# Patient Record
Sex: Female | Born: 1985 | Race: Black or African American | Hispanic: No | Smoking: Former smoker
Health system: Southern US, Community
[De-identification: ages and names within clinical notes are randomized; demographics above are authoritative.]

## PROBLEM LIST (undated history)

## (undated) ENCOUNTER — Inpatient Hospital Stay (HOSPITAL_COMMUNITY): Payer: Self-pay

## (undated) DIAGNOSIS — E559 Vitamin D deficiency, unspecified: Secondary | ICD-10-CM

## (undated) DIAGNOSIS — IMO0002 Reserved for concepts with insufficient information to code with codable children: Secondary | ICD-10-CM

## (undated) DIAGNOSIS — B999 Unspecified infectious disease: Secondary | ICD-10-CM

## (undated) DIAGNOSIS — D649 Anemia, unspecified: Secondary | ICD-10-CM

## (undated) HISTORY — DX: Unspecified infectious disease: B99.9

## (undated) HISTORY — DX: Vitamin D deficiency, unspecified: E55.9

---
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
## 2006-11-22 ENCOUNTER — Inpatient Hospital Stay (HOSPITAL_COMMUNITY): Admission: AD | Admit: 2006-11-22 | Discharge: 2006-11-23 | Payer: Self-pay | Admitting: Gynecology

## 2006-12-06 ENCOUNTER — Encounter: Admission: RE | Admit: 2006-12-06 | Discharge: 2006-12-06 | Payer: Self-pay | Admitting: Family Medicine

## 2007-03-24 ENCOUNTER — Emergency Department (HOSPITAL_COMMUNITY): Admission: EM | Admit: 2007-03-24 | Discharge: 2007-03-24 | Payer: Self-pay | Admitting: Emergency Medicine

## 2007-06-16 DIAGNOSIS — IMO0002 Reserved for concepts with insufficient information to code with codable children: Secondary | ICD-10-CM

## 2007-06-16 DIAGNOSIS — R87619 Unspecified abnormal cytological findings in specimens from cervix uteri: Secondary | ICD-10-CM

## 2007-06-16 HISTORY — DX: Reserved for concepts with insufficient information to code with codable children: IMO0002

## 2007-06-16 HISTORY — DX: Unspecified abnormal cytological findings in specimens from cervix uteri: R87.619

## 2007-06-20 ENCOUNTER — Inpatient Hospital Stay (HOSPITAL_COMMUNITY): Admission: AD | Admit: 2007-06-20 | Discharge: 2007-06-20 | Payer: Self-pay | Admitting: Family Medicine

## 2008-01-23 ENCOUNTER — Inpatient Hospital Stay (HOSPITAL_COMMUNITY): Admission: AD | Admit: 2008-01-23 | Discharge: 2008-01-23 | Payer: Self-pay | Admitting: Obstetrics & Gynecology

## 2008-02-21 ENCOUNTER — Emergency Department (HOSPITAL_COMMUNITY): Admission: EM | Admit: 2008-02-21 | Discharge: 2008-02-21 | Payer: Self-pay | Admitting: Emergency Medicine

## 2008-09-17 ENCOUNTER — Emergency Department (HOSPITAL_COMMUNITY): Admission: EM | Admit: 2008-09-17 | Discharge: 2008-09-18 | Payer: Self-pay | Admitting: Emergency Medicine

## 2009-08-30 ENCOUNTER — Emergency Department (HOSPITAL_COMMUNITY): Admission: EM | Admit: 2009-08-30 | Discharge: 2009-08-30 | Payer: Self-pay | Admitting: Emergency Medicine

## 2010-06-17 ENCOUNTER — Emergency Department (HOSPITAL_COMMUNITY)
Admission: EM | Admit: 2010-06-17 | Discharge: 2010-06-17 | Payer: Self-pay | Source: Home / Self Care | Admitting: Emergency Medicine

## 2010-09-05 LAB — URINALYSIS, ROUTINE W REFLEX MICROSCOPIC
Bilirubin Urine: NEGATIVE
Glucose, UA: NEGATIVE mg/dL
Nitrite: NEGATIVE
Protein, ur: NEGATIVE mg/dL
Specific Gravity, Urine: 1.025 (ref 1.005–1.030)
Urobilinogen, UA: 1 mg/dL (ref 0.0–1.0)

## 2010-09-05 LAB — POCT PREGNANCY, URINE: Preg Test, Ur: NEGATIVE

## 2010-09-05 LAB — URINE MICROSCOPIC-ADD ON

## 2010-10-04 ENCOUNTER — Emergency Department (HOSPITAL_COMMUNITY)
Admission: EM | Admit: 2010-10-04 | Discharge: 2010-10-05 | Disposition: A | Discharge status: Home / Self Care | Payer: Self-pay | Attending: Emergency Medicine | Admitting: Emergency Medicine

## 2010-10-04 DIAGNOSIS — R300 Dysuria: Secondary | ICD-10-CM | POA: Insufficient documentation

## 2010-10-04 DIAGNOSIS — A5901 Trichomonal vulvovaginitis: Secondary | ICD-10-CM | POA: Insufficient documentation

## 2010-10-04 DIAGNOSIS — N949 Unspecified condition associated with female genital organs and menstrual cycle: Secondary | ICD-10-CM | POA: Insufficient documentation

## 2010-10-04 DIAGNOSIS — R109 Unspecified abdominal pain: Secondary | ICD-10-CM | POA: Insufficient documentation

## 2010-10-04 DIAGNOSIS — Z202 Contact with and (suspected) exposure to infections with a predominantly sexual mode of transmission: Secondary | ICD-10-CM | POA: Insufficient documentation

## 2010-10-04 DIAGNOSIS — N898 Other specified noninflammatory disorders of vagina: Secondary | ICD-10-CM | POA: Insufficient documentation

## 2010-10-04 DIAGNOSIS — R110 Nausea: Secondary | ICD-10-CM | POA: Insufficient documentation

## 2010-10-05 ENCOUNTER — Emergency Department (HOSPITAL_COMMUNITY): Payer: Self-pay

## 2010-10-05 LAB — URINE MICROSCOPIC-ADD ON

## 2010-10-05 LAB — WET PREP, GENITAL: Yeast Wet Prep HPF POC: NONE SEEN

## 2010-10-05 LAB — URINALYSIS, ROUTINE W REFLEX MICROSCOPIC
Bilirubin Urine: NEGATIVE
Glucose, UA: NEGATIVE mg/dL
Ketones, ur: 15 mg/dL — AB
Protein, ur: NEGATIVE mg/dL

## 2010-10-07 LAB — GC/CHLAMYDIA PROBE AMP, GENITAL
Chlamydia, DNA Probe: NEGATIVE
GC Probe Amp, Genital: NEGATIVE

## 2010-10-07 LAB — URINE CULTURE: Culture  Setup Time: 201204221127

## 2010-12-31 ENCOUNTER — Inpatient Hospital Stay (INDEPENDENT_AMBULATORY_CARE_PROVIDER_SITE_OTHER)
Admit: 2010-12-31 | Discharge: 2010-12-31 | Disposition: A | Discharge status: Home / Self Care | Payer: Self-pay | Attending: Family Medicine | Admitting: Family Medicine

## 2010-12-31 ENCOUNTER — Emergency Department (HOSPITAL_COMMUNITY)
Admission: EM | Admit: 2010-12-31 | Discharge: 2010-12-31 | Discharge status: Against Medical Advice | Payer: Self-pay | Attending: Emergency Medicine | Admitting: Emergency Medicine

## 2010-12-31 DIAGNOSIS — N760 Acute vaginitis: Secondary | ICD-10-CM

## 2010-12-31 DIAGNOSIS — R109 Unspecified abdominal pain: Secondary | ICD-10-CM | POA: Insufficient documentation

## 2010-12-31 LAB — POCT URINALYSIS DIP (DEVICE)
Glucose, UA: NEGATIVE mg/dL
Hgb urine dipstick: NEGATIVE
Leukocytes, UA: NEGATIVE
Nitrite: NEGATIVE
Urobilinogen, UA: 2 mg/dL — ABNORMAL HIGH (ref 0.0–1.0)

## 2010-12-31 LAB — WET PREP, GENITAL
Clue Cells Wet Prep HPF POC: NONE SEEN
Trich, Wet Prep: NONE SEEN

## 2010-12-31 LAB — POCT PREGNANCY, URINE: Preg Test, Ur: NEGATIVE

## 2011-01-01 LAB — GC/CHLAMYDIA PROBE AMP, GENITAL: Chlamydia, DNA Probe: NEGATIVE

## 2011-03-05 LAB — WET PREP, GENITAL: Yeast Wet Prep HPF POC: NONE SEEN

## 2011-03-05 LAB — CBC
Hemoglobin: 11.9 — ABNORMAL LOW
MCHC: 34.7
MCV: 94.5
RBC: 3.63 — ABNORMAL LOW
RDW: 12.6

## 2011-03-05 LAB — GC/CHLAMYDIA PROBE AMP, GENITAL: GC Probe Amp, Genital: NEGATIVE

## 2011-03-05 LAB — POCT PREGNANCY, URINE: Preg Test, Ur: NEGATIVE

## 2011-03-18 LAB — RAPID STREP SCREEN (MED CTR MEBANE ONLY): Streptococcus, Group A Screen (Direct): NEGATIVE

## 2011-03-26 LAB — POCT URINALYSIS DIP (DEVICE)
Bilirubin Urine: NEGATIVE
Hgb urine dipstick: NEGATIVE
Nitrite: NEGATIVE
Protein, ur: NEGATIVE
Urobilinogen, UA: 2 — ABNORMAL HIGH
pH: 7

## 2011-03-26 LAB — POCT PREGNANCY, URINE
Operator id: 239701
Preg Test, Ur: NEGATIVE

## 2011-03-26 LAB — WET PREP, GENITAL: Yeast Wet Prep HPF POC: NONE SEEN

## 2011-04-02 LAB — URINE MICROSCOPIC-ADD ON

## 2011-04-02 LAB — CBC
MCV: 93.2
Platelets: 304
RBC: 3.65 — ABNORMAL LOW
WBC: 7.4

## 2011-04-02 LAB — WET PREP, GENITAL: Trich, Wet Prep: NONE SEEN

## 2011-04-02 LAB — URINALYSIS, ROUTINE W REFLEX MICROSCOPIC
Glucose, UA: NEGATIVE
Ketones, ur: NEGATIVE
Leukocytes, UA: NEGATIVE
Nitrite: POSITIVE — AB
Specific Gravity, Urine: 1.025
pH: 6

## 2011-04-02 LAB — GC/CHLAMYDIA PROBE AMP, GENITAL: GC Probe Amp, Genital: NEGATIVE

## 2011-06-16 HISTORY — PX: MOUTH SURGERY: SHX715

## 2011-07-29 ENCOUNTER — Emergency Department (INDEPENDENT_AMBULATORY_CARE_PROVIDER_SITE_OTHER)
Admission: EM | Admit: 2011-07-29 | Discharge: 2011-07-29 | Disposition: A | Discharge status: Home / Self Care | Payer: Self-pay | Source: Home / Self Care | Attending: Emergency Medicine | Admitting: Emergency Medicine

## 2011-07-29 ENCOUNTER — Encounter (HOSPITAL_COMMUNITY): Payer: Self-pay | Admitting: Emergency Medicine

## 2011-07-29 DIAGNOSIS — J069 Acute upper respiratory infection, unspecified: Secondary | ICD-10-CM

## 2011-07-29 MED ORDER — BENZONATATE 200 MG PO CAPS: 200.0000 mg | ORAL_CAPSULE | Freq: Three times a day (TID) | ORAL | Status: AC | PRN

## 2011-07-29 MED ORDER — DICLOFENAC SODIUM 75 MG PO TBEC: 75.0000 mg | DELAYED_RELEASE_TABLET | Freq: Two times a day (BID) | ORAL | Status: DC

## 2011-07-29 NOTE — ED Notes (Signed)
Pt having a sore throat and body aches since yesterday. She has had symptoms of a sinus headache for about 3 weeks but just recently started getting cold symptoms. She has runny nose, productive cough, and difficulty swallowing.

## 2011-07-29 NOTE — ED Provider Notes (Signed)
Chief Complaint  Patient presents with  . Sore Throat    History of Present Illness:   The patient has had a four-day history of sore throat for myalgias, chills, headache, nasal congestion, sneezing, rhinorrhea with green drainage, and cough productive of yellow sputum. She's not been exposed to strep, to mono, or to influenza. She denies any shortness of breath, wheezing, nausea, or vomiting.  Review of Systems:  Other than noted above, the patient denies any of the following symptoms. Systemic:  No fever, chills, sweats, fatigue, myalgias, headache, or anorexia. Eye:  No redness, pain or drainage. ENT:  No earache, nasal congestion, rhinorrhea, sinus pressure, or sore throat. Lungs:  No cough, sputum production, wheezing, shortness of breath. Or chest pain. GI:  No nausea, vomiting, abdominal pain or diarrhea. Skin:  No rash or itching.  PMFSH:  Past medical history, family history, social history, meds, and allergies were reviewed.  Physical Exam:   Vital signs:  BP 108/66  Pulse 78  Temp(Src) 99.2 F (37.3 C) (Oral)  Resp 16  SpO2 100%  LMP 07/24/2011 General:  Alert, in no distress. Eye:  No conjunctival injection or drainage. ENT:  TMs and canals were normal, without erythema or inflammation.  Nasal mucosa was clear and uncongested, without drainage.  Mucous membranes were moist.  Tonsils were enlarged and red without exudate.  There were no oral ulcerations or lesions. Neck:  Supple, no adenopathy, tenderness or mass. Lungs:  No respiratory distress.  Lungs were clear to auscultation, without wheezes, rales or rhonchi.  Breath sounds were clear and equal bilaterally. Heart:  Regular rhythm, without gallops, murmers or rubs. Skin:  Clear, warm, and dry, without rash or lesions.   Radiology:  No results found.  Assessment:   Diagnoses that have been ruled out:  None  Diagnoses that are still under consideration:  None  Final diagnoses:  Upper respiratory infection        Plan:   1.  The following meds were prescribed:   New Prescriptions   BENZONATATE (TESSALON) 200 MG CAPSULE    Take 1 capsule (200 mg total) by mouth 3 (three) times daily as needed for cough.   DICLOFENAC (VOLTAREN) 75 MG EC TABLET    Take 1 tablet (75 mg total) by mouth 2 (two) times daily.   2.  The patient was instructed in symptomatic care and handouts were given. 3.  The patient was told to return if becoming worse in any way, if no better in 3 or 4 days, and given some red flag symptoms that would indicate earlier return.   Roque Lias, MD 07/30/11 [phone]

## 2011-07-29 NOTE — Discharge Instructions (Signed)
Most upper respiratory infections are caused by viruses and do not require antibiotics.  We try to save the antibiotics for when we really need them to avoid resistance.  This does not mean that there is nothing that can be done.  Here are a few hints about things that can be done at home to get over an upper respiratory infection quicker:  Get extra sleep and extra fluids.  Get 7 to 9 hours of sleep per night and 6 to 8 glasses of water a day.  Getting extra sleep keeps the immune system from getting run down.  Most people with an upper respiratory infection are a little dehydrated.  The extra fluids also keep the secretions liquified and easier to deal with.  Also, get extra vitamin C.  4000 mg per day is the recommended dose. For the aches, headache, and fever, acetaminophen or ibuprofen are helpful.  These can be alternated every 4 hours.  People with liver disease should avoid large amounts of acetaminophen, and people with ulcer disease, gastroesophageal reflux, gastritis, congestive heart failure, chronic kidney disease, coronary artery disease and the elderly should avoid ibuprofen. For nasal congestion try Mucinex-D, or if you're having lots of sneezing or copious clear nasal drainage Allegra-D-24 hour.  A Saline nasal spray such as Ocean Spray can also help as can decongestant sprays such as Afrin, but you should not use the decongestant sprays for more than 3 or 4 days since they can be habituating.  If nasal dryness is a problem, Ayr Nasal Gel can help moisturize your nasal passages.  Breath Rite nasal strips can also offer a non-drug alternative treatment to nasal congestion, especially at night. For people with symptoms of sinusitis, sleeping with your head elevated can be helpful.  For sinus pain, moist, hot compresses to the face may provide some relief.  Many people find that inhaling steam as in a shower or from a pot of steaming water can help. For sore throat, zinc containing lozenges such  as Cold-Eze or Zicam are helpful.  Zinc helps to fight infection and has a mild astringent effect that relieves the sore, achey throat.  Hot salt water gargles (8 oz of hot water, 1/2 tsp of table salt, and a pinch of baking soda) can give relief as well as hot beverages such as hot tea. For the cough, old time remedies such as honey or honey and lemon are tried and true.  Over the counter cough syrups such as Delsym 2 tsp every 12 hours can help as well.  It's important when you have an upper respiratory infection not to pass the infection to others.  This involves being very careful about the following:  Frequent hand washing or use of hand sanitizer, especially after coughing, sneezing, blowing your nose or touching your face, nose or eyes. Do not shake hands or touch anyone and try to avoid touching surfaces that other people use such as doorknobs, shopping carts, telephones and computer keyboards. Use tissues and dispose of them properly in a garbage can or ziplock bag. Cough into your sleeve. Do not let others eat or drink after you.  It's also important to recognize the signs of serious illness and get evaluated if they occur: Any respiratory infection that lasts more than 7 to 10 days.  Yellow nasal drainage and sputum are not reliable indicators of a bacterial infection, but if they last for more than 1 week, see your doctor. Fever and sore throat can indicate strep. Fever   and cough can indicate influenza or pneumonia. Any kind of severe symptom such as difficulty breathing, intractable vomiting, or severe pain should prompt you to see a doctor as soon as possible.   Your body's immune system is really the thing that will get rid of this infection.  Your immune system is comprised of 2 types of specialized cells called T cells and B cells.  T cells coordinate the array of cells in your body that engulf invading bacteria or viruses while B cells orchestrate the production of antibodies that  neutralize infection.  Anything we do or any medications we give you, will just strengthen your immune system or help it clear up the infection quicker.  Here are a few helpful hints to improve your immune system to help overcome this illness or to prevent future infections:  A few vitamins can improve the health of your immune system.  That's why your diet should include plenty of fruits, vegetables, fish, nuts, and whole grains.  Vitamin A and bet-carotene can increase the cells that fight infections (T cells and B cells).  Vitamin A is abundant in dark greens and orange vegetables such as spinach, greens, sweet potatoes, and carrots.  Vitamin B6 contributes to the maturation of white blood cells, the cells that fight disease.  Foods with vitamin B6 include cold cereal and bananas.  Vitamin C is credited with preventing colds because it increases white blood cells and also prevents cellular damage.  Citrus fruits, peaches and green and red bell peppers are all hight in vitamin C.  Vitamin E is an anti-oxidant that encourages the production of natural killer cells which reject foreign invaders and B cells that produce antibodies.  Foods high in vitamin E include wheat germ, nuts and seeds.  Foods high in omega-3 fatty acids found in foods like salmon, tuna and mackerel boost your immune system and help cells to engulf and absorb germs.  Probiotics are good bacteria that increase your T cells.  These can be found in yogurt and are available in supplements such as Culturelle or Align.  Moderate exercise increases the strength of your immune system and your ability to recover from illness.  I suggest 3 to 5 moderate intensity 30 minute workouts per week.    Sleep is another component of maintaining a strong immune system.  It enables your body to recuperate from the day's activities, stress and work.  My recommendation is to get between 7 and 9 hours of sleep per night.  If you smoke, try to quit  completely or at least cut down.  Drink alcohol only in moderation if at all.  No more than 2 drinks daily for men or 1 for women.  Get a flu vaccine early in the fall or if you have not gotten one yet, once this illness has run its course.  If you are over 65, a smoker, or an asthmatic, get a pneumococcal vaccine.  My final recommendation is to maintain a healthy weight.  Excess weight can impair the immune system by interfering with the way the immune system deals with invading viruses or bacteria.   Just a few good health habits can help you add years to your life and enjoy your life more.  Here are my top 10.    1.  Do not use tobacco at all. If you need help quitting, discuss this with your physician.  The same thing applies to all recreational drugs.  If you drink, drink only in  moderation.  This means no more than 2 drinks per day for men or 1 drink per day for women.  2.  Keep your weight under control.  The Body Mass Index (BMI) is one measure of a person's weight compared to height.  It can be calculated by the following formula:  Weight in pounds x 706/(height in inches)x(height in inches).  Or you can ask your health care provider to figure it out for you.  A BMI of 20 - 25 is considered ideal.  25 - 30 is overweight, and over 30 is obese.  If you are overweight, discuss healthy weight loss plans with your physician.  3.  Get 30 minutes of moderate intensity exercise (such as walking) at least 5 days a week.  4.  If you are a female and do not wish to become pregnant, use a birth control method.  You may want to discuss this with your physician.  Both males and females should use protection against STDs (such as condoms) unless in a committed, mutually monogamous, long term relationship.  5.  Eat a good diet.  Try to get at least 5 servings of fruits and vegetables per day.  Avoid excessive salt.  For most people, this means less than 2300 mg of salt per day (1 tsp).  For people at  higher risk (strong family history, personal history of hypertension, African-American ethnicity) less than 1500 mg of salt per day is recommended. Avoid sugar sweetened beverages such as sweetened tea, sodas, and juices.  Avoid eating out or fast food more than twice a week.  Avoid fried food as much as possible.  6.  Get your suggested screenings.  Women who are sexually active should get a yearly Pap smear and chlamydia screening.  Women over 40 should get a mammogram every other year and over fifty every year.  Men and women over 50 should get a colonoscopy every 10 years.  Women over 65 should get a bone density scan.  I also suggest screening for HIV and hepatitis C.  7.  Get blood pressure checked as often as possible.  Yearly screening for cholesterol level and blood glucose is also recommened.  8.  Have a primary care doctor and get an annual check up.  If you don't have one, you can call [phone] for a referral.  9.  Get your recommended immunizations.  For adults, I recommend the following:  A yearly flu vaccine in the fall, a vaccine for tetanus, diphtheria, and pertussis (whooping cough) or Tdap once every 10 years, a pneumonia vaccine at age 42, and a shingles vaccine at age 22.  Many of these can be gotten at the health department or at local pharmacies.  10.  Get sufficient sleep.  7 to 9 hours per night is the recommended amount.

## 2012-03-11 ENCOUNTER — Emergency Department (HOSPITAL_COMMUNITY)
Admission: EM | Admit: 2012-03-11 | Discharge: 2012-03-11 | Disposition: A | Discharge status: Home / Self Care | Payer: Self-pay | Attending: Emergency Medicine | Admitting: Emergency Medicine

## 2012-03-11 ENCOUNTER — Emergency Department (HOSPITAL_COMMUNITY): Payer: Self-pay

## 2012-03-11 ENCOUNTER — Encounter (HOSPITAL_COMMUNITY): Payer: Self-pay | Admitting: Emergency Medicine

## 2012-03-11 DIAGNOSIS — Z880 Allergy status to penicillin: Secondary | ICD-10-CM | POA: Insufficient documentation

## 2012-03-11 DIAGNOSIS — R091 Pleurisy: Secondary | ICD-10-CM | POA: Insufficient documentation

## 2012-03-11 DIAGNOSIS — I10 Essential (primary) hypertension: Secondary | ICD-10-CM | POA: Insufficient documentation

## 2012-03-11 LAB — URINALYSIS, ROUTINE W REFLEX MICROSCOPIC
Bilirubin Urine: NEGATIVE
Glucose, UA: NEGATIVE mg/dL
Hgb urine dipstick: NEGATIVE
Ketones, ur: NEGATIVE mg/dL
Leukocytes, UA: NEGATIVE
Protein, ur: NEGATIVE mg/dL
pH: 7 (ref 5.0–8.0)

## 2012-03-11 MED ORDER — IBUPROFEN 600 MG PO TABS: 600.0000 mg | ORAL_TABLET | Freq: Four times a day (QID) | ORAL | Status: DC | PRN

## 2012-03-11 MED ORDER — HYDROCODONE-ACETAMINOPHEN 5-500 MG PO TABS: 1.0000 | ORAL_TABLET | Freq: Four times a day (QID) | ORAL | Status: DC | PRN

## 2012-03-11 NOTE — ED Notes (Signed)
Pt c/o left upper back pain since yesterday, worse with breathing, reports cough yesterday.

## 2012-03-11 NOTE — ED Provider Notes (Signed)
History     CSN: [id number]  Arrival date & time 03/11/12  0901   First MD Initiated Contact with Patient 03/11/12 [phone]      Chief Complaint  Patient presents with  . Back Pain    (Consider location/radiation/quality/duration/timing/severity/associated sxs/prior treatment) HPI Comments: Jessica Mahoney is a 26 y.o. Female who presents to ED with complaint of left flank pain. States pain started yesterday while she was just laying down. States pain worsened over night and this morning. Yesterday felt feverish, measured her temperature and it was 101. States she is having mild cough, shortness of breath, pain with deep breathing. States no chest pain, no injury, no unusual activity, no pain with urination, no urinary frequency or urgency, no abdominal pain.   Past Medical History  Diagnosis Date  . Hypertension     History reviewed. No pertinent past surgical history.  History reviewed. No pertinent family history.  History  Substance Use Topics  . Smoking status: Never Smoker   . Smokeless tobacco: Not on file  . Alcohol Use: No    OB History    Grav Para Term Preterm Abortions TAB SAB Ect Mult Living                  Review of Systems  Constitutional: Negative for fever and chills.  HENT: Negative for neck pain and neck stiffness.   Respiratory: Positive for cough and shortness of breath. Negative for chest tightness and wheezing.   Cardiovascular: Negative for chest pain.  Gastrointestinal: Negative for nausea, vomiting, abdominal pain and abdominal distention.  Genitourinary: Positive for flank pain. Negative for dysuria, urgency, hematuria, vaginal bleeding, vaginal discharge, vaginal pain and menstrual problem.  Musculoskeletal: Positive for back pain.  Skin: Negative.   Neurological: Negative for dizziness, weakness and headaches.    Allergies  Penicillins  Home Medications   Current Outpatient Rx  Name Route Sig Dispense Refill  . DICLOFENAC SODIUM 75  MG PO TBEC Oral Take 1 tablet (75 mg total) by mouth 2 (two) times daily. 20 tablet 0    BP 101/64  Pulse 82  Temp 98.2 F (36.8 C) (Oral)  Resp 16  Ht 5\' 7"  (1.702 m)  Wt 138 lb (62.596 kg)  BMI 21.61 kg/m2  SpO2 100%  LMP 02/25/2012  Physical Exam  Nursing note and vitals reviewed. Constitutional: She is oriented to person, place, and time. She appears well-developed and well-nourished. No distress.  HENT:  Head: Normocephalic.  Eyes: Conjunctivae normal are normal.  Neck: Neck supple.  Cardiovascular: Normal rate, regular rhythm and normal heart sounds.   Pulmonary/Chest: Effort normal and breath sounds normal. No respiratory distress. She has no wheezes. She has no rales.  Abdominal: Soft. Bowel sounds are normal. She exhibits no distension. There is no tenderness. There is no rebound and no guarding.       Left CVA tenderness  Musculoskeletal: Normal range of motion. She exhibits no edema and no tenderness.  Neurological: She is alert and oriented to person, place, and time.  Skin: Skin is warm and dry.  Psychiatric: She has a normal mood and affect.    ED Course  Procedures (including critical care time)  Pt with left flank/back pain, worse with breathing and coughing. Fever yesterday. Will check UA, d-dimer, CXR.   Results for orders placed during the hospital encounter of 03/11/12  URINALYSIS, ROUTINE W REFLEX MICROSCOPIC      Component Value Range   Color, Urine YELLOW  YELLOW  APPearance CLOUDY (*) CLEAR   Specific Gravity, Urine 1.026  1.005 - 1.030   pH 7.0  5.0 - 8.0   Glucose, UA NEGATIVE  NEGATIVE mg/dL   Hgb urine dipstick NEGATIVE  NEGATIVE   Bilirubin Urine NEGATIVE  NEGATIVE   Ketones, ur NEGATIVE  NEGATIVE mg/dL   Protein, ur NEGATIVE  NEGATIVE mg/dL   Urobilinogen, UA 1.0  0.0 - 1.0 mg/dL   Nitrite NEGATIVE  NEGATIVE   Leukocytes, UA NEGATIVE  NEGATIVE  D-DIMER, QUANTITATIVE      Component Value Range   D-Dimer, Quant <0.27  0.00 - 0.48  ug/mL-FEU  PREGNANCY, URINE      Component Value Range   Preg Test, Ur NEGATIVE  NEGATIVE   Dg Chest 2 View  03/11/2012  *RADIOLOGY REPORT*  Clinical Data: Left-sided chest and back pain.  CHEST - 2 VIEW  Comparison: 10/21/2003 thoracic spine radiograph  Findings: Lungs are clear. No pleural effusion or pneumothorax. The cardiomediastinal contours are within normal limits. The visualized bones and soft tissues are without significant appreciable abnormality.  IMPRESSION: No radiographic evidence of acute cardiopulmonary process.   Original Report Authenticated By: Waneta Martins, M.D.     11:01 AM Pt's cxr, d-dimer, urine are negative. She is afebrile here. Normal vs. Suspect muscle strain vs pleurisy. Will treat with pain medications, anti inflammatories, follow up as needed.   Filed Vitals:   03/11/12 1102  BP: 96/60  Pulse: 72  Temp:   Resp: 16     1. Pleurisy       MDM          Lottie Mussel, PA 03/11/12 1124

## 2012-06-05 ENCOUNTER — Emergency Department (HOSPITAL_COMMUNITY)
Admission: EM | Admit: 2012-06-05 | Discharge: 2012-06-05 | Disposition: A | Discharge status: Home / Self Care | Payer: No Typology Code available for payment source | Attending: Emergency Medicine | Admitting: Emergency Medicine

## 2012-06-05 ENCOUNTER — Emergency Department (HOSPITAL_COMMUNITY): Payer: No Typology Code available for payment source

## 2012-06-05 ENCOUNTER — Encounter (HOSPITAL_COMMUNITY): Payer: Self-pay | Admitting: Emergency Medicine

## 2012-06-05 DIAGNOSIS — R079 Chest pain, unspecified: Secondary | ICD-10-CM | POA: Insufficient documentation

## 2012-06-05 DIAGNOSIS — Z3202 Encounter for pregnancy test, result negative: Secondary | ICD-10-CM | POA: Insufficient documentation

## 2012-06-05 DIAGNOSIS — Y92410 Unspecified street and highway as the place of occurrence of the external cause: Secondary | ICD-10-CM | POA: Insufficient documentation

## 2012-06-05 DIAGNOSIS — Y9389 Activity, other specified: Secondary | ICD-10-CM | POA: Insufficient documentation

## 2012-06-05 DIAGNOSIS — M542 Cervicalgia: Secondary | ICD-10-CM | POA: Insufficient documentation

## 2012-06-05 DIAGNOSIS — I10 Essential (primary) hypertension: Secondary | ICD-10-CM | POA: Insufficient documentation

## 2012-06-05 DIAGNOSIS — F17200 Nicotine dependence, unspecified, uncomplicated: Secondary | ICD-10-CM | POA: Insufficient documentation

## 2012-06-05 DIAGNOSIS — S0990XA Unspecified injury of head, initial encounter: Secondary | ICD-10-CM | POA: Insufficient documentation

## 2012-06-05 DIAGNOSIS — M549 Dorsalgia, unspecified: Secondary | ICD-10-CM | POA: Insufficient documentation

## 2012-06-05 LAB — POCT PREGNANCY, URINE: Preg Test, Ur: NEGATIVE

## 2012-06-05 MED ORDER — IBUPROFEN 600 MG PO TABS: 600.0000 mg | ORAL_TABLET | Freq: Four times a day (QID) | ORAL | Status: DC | PRN

## 2012-06-05 MED ORDER — CYCLOBENZAPRINE HCL 10 MG PO TABS: 10.0000 mg | ORAL_TABLET | Freq: Two times a day (BID) | ORAL | Status: DC | PRN

## 2012-06-05 NOTE — ED Provider Notes (Signed)
Medical screening examination/treatment/procedure(s) were performed by non-physician practitioner and as supervising physician I was immediately available for consultation/collaboration.  Flint Melter, MD 06/05/12 [phone]
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
## 2012-06-14 ENCOUNTER — Encounter (HOSPITAL_COMMUNITY): Payer: Self-pay

## 2012-06-14 ENCOUNTER — Inpatient Hospital Stay (HOSPITAL_COMMUNITY)
Admission: AD | Admit: 2012-06-14 | Discharge: 2012-06-14 | Disposition: A | Discharge status: Home / Self Care | Payer: Self-pay | Source: Ambulatory Visit | Attending: Obstetrics & Gynecology | Admitting: Obstetrics & Gynecology

## 2012-06-14 ENCOUNTER — Inpatient Hospital Stay (HOSPITAL_COMMUNITY): Payer: Self-pay

## 2012-06-14 DIAGNOSIS — Z3490 Encounter for supervision of normal pregnancy, unspecified, unspecified trimester: Secondary | ICD-10-CM

## 2012-06-14 DIAGNOSIS — N949 Unspecified condition associated with female genital organs and menstrual cycle: Secondary | ICD-10-CM | POA: Insufficient documentation

## 2012-06-14 DIAGNOSIS — R109 Unspecified abdominal pain: Secondary | ICD-10-CM | POA: Insufficient documentation

## 2012-06-14 DIAGNOSIS — O99891 Other specified diseases and conditions complicating pregnancy: Secondary | ICD-10-CM | POA: Insufficient documentation

## 2012-06-14 HISTORY — DX: Reserved for concepts with insufficient information to code with codable children: IMO0002

## 2012-06-14 LAB — URINALYSIS, ROUTINE W REFLEX MICROSCOPIC
Glucose, UA: NEGATIVE mg/dL
Ketones, ur: NEGATIVE mg/dL
Leukocytes, UA: NEGATIVE
Nitrite: NEGATIVE
Specific Gravity, Urine: 1.02 (ref 1.005–1.030)
pH: 7 (ref 5.0–8.0)

## 2012-06-14 LAB — OB RESULTS CONSOLE GC/CHLAMYDIA
Chlamydia: NEGATIVE
Gonorrhea: NEGATIVE

## 2012-06-14 LAB — CBC
MCV: 90.9 fL (ref 78.0–100.0)
Platelets: 274 10*3/uL (ref 150–400)
RDW: 11.6 % (ref 11.5–15.5)
WBC: 8.8 10*3/uL (ref 4.0–10.5)

## 2012-06-14 LAB — POCT PREGNANCY, URINE: Preg Test, Ur: POSITIVE — AB

## 2012-06-14 LAB — WET PREP, GENITAL
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
## 2012-06-14 NOTE — MAU Note (Signed)
[facility]. Denies vaginal bleeding. White vaginal discharge, creamy & malodorous. Lower abdominal sharp pain that comes & goes; worse after urination; started last week.

## 2012-06-15 LAB — GC/CHLAMYDIA PROBE AMP: CT Probe RNA: NEGATIVE

## 2012-07-02 NOTE — MAU Provider Note (Signed)
Attestation of Attending Supervision of Advanced Practitioner (CNM/NP): Evaluation and management procedures were performed by the Advanced Practitioner under my supervision and collaboration. I have reviewed the Advanced Practitioner's note and chart, and I agree with the management and plan.  Kajsa Butrum H. 11:53 AM   

## 2012-07-16 ENCOUNTER — Encounter (HOSPITAL_COMMUNITY): Payer: Self-pay

## 2012-07-16 ENCOUNTER — Inpatient Hospital Stay (HOSPITAL_COMMUNITY)
Admission: AD | Admit: 2012-07-16 | Discharge: 2012-07-16 | Disposition: A | Discharge status: Home / Self Care | Payer: Medicaid Other | Source: Ambulatory Visit | Attending: Obstetrics & Gynecology | Admitting: Obstetrics & Gynecology

## 2012-07-16 DIAGNOSIS — R109 Unspecified abdominal pain: Secondary | ICD-10-CM | POA: Insufficient documentation

## 2012-07-16 DIAGNOSIS — A499 Bacterial infection, unspecified: Secondary | ICD-10-CM

## 2012-07-16 DIAGNOSIS — M549 Dorsalgia, unspecified: Secondary | ICD-10-CM | POA: Insufficient documentation

## 2012-07-16 DIAGNOSIS — O2390 Unspecified genitourinary tract infection in pregnancy, unspecified trimester: Secondary | ICD-10-CM | POA: Insufficient documentation

## 2012-07-16 DIAGNOSIS — K5900 Constipation, unspecified: Secondary | ICD-10-CM | POA: Insufficient documentation

## 2012-07-16 DIAGNOSIS — O26899 Other specified pregnancy related conditions, unspecified trimester: Secondary | ICD-10-CM

## 2012-07-16 DIAGNOSIS — N760 Acute vaginitis: Secondary | ICD-10-CM | POA: Insufficient documentation

## 2012-07-16 DIAGNOSIS — B9689 Other specified bacterial agents as the cause of diseases classified elsewhere: Secondary | ICD-10-CM

## 2012-07-16 LAB — URINALYSIS, ROUTINE W REFLEX MICROSCOPIC
Bilirubin Urine: NEGATIVE
Nitrite: NEGATIVE
Specific Gravity, Urine: >1.03 — ABNORMAL HIGH (ref 1.005–1.030)
pH: 6 (ref 5.0–8.0)

## 2012-07-16 LAB — URINE MICROSCOPIC-ADD ON

## 2012-07-16 LAB — WET PREP, GENITAL: Trich, Wet Prep: NONE SEEN

## 2012-07-16 MED ORDER — METRONIDAZOLE 500 MG PO TABS: 500.0000 mg | ORAL_TABLET | Freq: Two times a day (BID) | ORAL | Status: DC

## 2012-07-16 NOTE — MAU Note (Signed)
Onset of abdominal and back pain about one hour ago after eating [place] food, denies vaginal bleeding.

## 2012-07-16 NOTE — MAU Note (Signed)
Has had constipated last BM 3 days.

## 2012-07-16 NOTE — MAU Provider Note (Signed)
History     CSN: [id number]  Arrival date and time: 07/16/12 1191   First Provider Initiated Contact with Patient 07/16/12 1904      Chief Complaint  Patient presents with  . Back Pain  . Abdominal Pain   HPI Jessica Mahoney is 27 y.o. G2P0010 [redacted]w[redacted]d weeks presenting with abdominal pain and back that began 1 hour ago.  Began while eating [place] food.   Craves spicy food.  Nauseated all the time, vomits occ.  Last 3 days, hard.  Rates as 8/10 at its worse and same now.  Decribes as gassy but now more pain.  Has white vaginal discharge without foul odor.  Denies vaginal bleeding.   Has not tried anything for pain.  Plans to see [facility] for prenatal care.  Got card in mail today will make appointment    Past Medical History  Diagnosis Date  . Abnormal Pap smear   . Hypertension     not on meds off 8 months ago.    Past Surgical History  Procedure Date  . Gynecologic cryosurgery 2007    Family History  Problem Relation Age of Onset  . Fibroids Mother   . Miscarriages / [place] Mother     2 ectopics and 1 miscarriage  . Diabetes Father   . Diabetes Maternal Aunt   . Hypertension Maternal Aunt   . Hypertension Maternal Grandmother   . Diabetes Maternal Grandmother   . Hepatitis C Maternal Grandmother     History  Substance Use Topics  . Smoking status: Former Smoker    Types: Cigarettes    Quit date: 06/11/2012  . Smokeless tobacco: Not on file  . Alcohol Use: Yes     Comment: socially- last use was last week    Allergies:  Allergies  Allergen Reactions  . Penicillins Hives and Itching    No prescriptions prior to admission    Review of Systems  Constitutional: Negative for fever and chills.  Respiratory: Negative.   Cardiovascular: Negative.   Gastrointestinal: Positive for nausea, abdominal pain and constipation. Negative for vomiting and diarrhea.  Genitourinary:       Neg for vaginal bleeding or discharge   Physical Exam   Blood pressure 121/62,  pulse 106, temperature 98.3 F (36.8 C), temperature source Oral, resp. rate 16, last menstrual period 05/08/2012.  Physical Exam  Constitutional: She is oriented to person, place, and time. She appears well-developed and well-nourished. No distress.  HENT:  Head: Normocephalic.  Neck: Normal range of motion.  Cardiovascular: Normal rate.   Respiratory: Effort normal.  GI: Soft. She exhibits no distension and no mass. There is no tenderness. There is no rebound and no guarding.  Genitourinary: There is no rash, tenderness or lesion on the right labia. There is no rash, tenderness or lesion on the left labia. Uterus is enlarged. Uterus is not tender. Cervix exhibits no motion tenderness, no discharge and no friability. Right adnexum displays no tenderness and no fullness. Left adnexum displays no tenderness and no fullness. No bleeding around the vagina. Vaginal discharge (small amount of frothy discharge without odor) found.  Neurological: She is alert and oriented to person, place, and time.  Skin: Skin is warm and dry.  Psychiatric: She has a normal mood and affect. Her behavior is normal.   Results for orders placed during the hospital encounter of 07/16/12 (from the past 24 hour(s))  URINALYSIS, ROUTINE W REFLEX MICROSCOPIC     Status: Abnormal   Collection Time  07/16/12  6:37 PM      Component Value Range   Color, Urine YELLOW  YELLOW   APPearance CLEAR  CLEAR   Specific Gravity, Urine >1.030 (*) 1.005 - 1.030   pH 6.0  5.0 - 8.0   Glucose, UA NEGATIVE  NEGATIVE mg/dL   Hgb urine dipstick NEGATIVE  NEGATIVE   Bilirubin Urine NEGATIVE  NEGATIVE   Ketones, ur NEGATIVE  NEGATIVE mg/dL   Protein, ur NEGATIVE  NEGATIVE mg/dL   Urobilinogen, UA 0.2  0.0 - 1.0 mg/dL   Nitrite NEGATIVE  NEGATIVE   Leukocytes, UA TRACE (*) NEGATIVE  URINE MICROSCOPIC-ADD ON     Status: Abnormal   Collection Time   07/16/12  6:37 PM      Component Value Range   Squamous Epithelial / LPF MANY (*) RARE    WBC, UA 3-6  <3 WBC/hpf   RBC / HPF 0-2  <3 RBC/hpf   Bacteria, UA MANY (*) RARE  WET PREP, GENITAL     Status: Abnormal   Collection Time   07/16/12  7:15 PM      Component Value Range   Yeast Wet Prep HPF POC NONE SEEN  NONE SEEN   Trich, Wet Prep NONE SEEN  NONE SEEN   Clue Cells Wet Prep HPF POC FEW (*) NONE SEEN   WBC, Wet Prep HPF POC MANY (*) NONE SEEN    Patient was concerned about the pregnancy.  She has had official ultrasound at 5+ weeks showing iup with YS.  Bedside ultrasound to confirm viability--+ cardiac activity and fetal movement seen MAU Course  Procedures  GC/CHL culture 06/14/12 here was negative.   MDM   Assessment and Plan  A:  Abdominal pain in first trimester pregnancy      Constipation      Bacterial vaginosis  P:  Rx for Flagyl      Patient was shown cardiac activity and movement on bedside ultrasound      Colace prn      Call [facility] on Monday to schedule initial prenatal visit.  Rukaya Kleinschmidt,EVE M 07/16/2012, 7:43 PM

## 2012-07-18 LAB — URINE CULTURE
Colony Count: NO GROWTH
Culture: NO GROWTH

## 2012-08-05 ENCOUNTER — Encounter: Payer: Self-pay | Admitting: Certified Nurse Midwife

## 2012-08-05 ENCOUNTER — Encounter: Payer: Medicaid Other | Admitting: Certified Nurse Midwife

## 2012-08-09 ENCOUNTER — Ambulatory Visit: Payer: Medicaid Other | Admitting: Obstetrics and Gynecology

## 2012-08-09 DIAGNOSIS — Z331 Pregnant state, incidental: Secondary | ICD-10-CM

## 2012-08-10 ENCOUNTER — Encounter: Payer: Self-pay | Admitting: Certified Nurse Midwife

## 2012-08-10 ENCOUNTER — Ambulatory Visit: Payer: Medicaid Other | Admitting: Certified Nurse Midwife

## 2012-08-10 VITALS — BP 108/58 | Wt 158.0 lb

## 2012-08-10 DIAGNOSIS — D259 Leiomyoma of uterus, unspecified: Secondary | ICD-10-CM

## 2012-08-10 DIAGNOSIS — Z331 Pregnant state, incidental: Secondary | ICD-10-CM

## 2012-08-10 DIAGNOSIS — N898 Other specified noninflammatory disorders of vagina: Secondary | ICD-10-CM

## 2012-08-10 DIAGNOSIS — Z124 Encounter for screening for malignant neoplasm of cervix: Secondary | ICD-10-CM

## 2012-08-10 DIAGNOSIS — K5900 Constipation, unspecified: Secondary | ICD-10-CM | POA: Insufficient documentation

## 2012-08-10 LAB — PRENATAL PANEL VII
Basophils Absolute: 0 10*3/uL (ref 0.0–0.1)
Basophils Relative: 0 % (ref 0–1)
HIV: NONREACTIVE
Hepatitis B Surface Ag: NEGATIVE
MCHC: 35.2 g/dL (ref 30.0–36.0)
Neutro Abs: 6.6 10*3/uL (ref 1.7–7.7)
Neutrophils Relative %: 70 % (ref 43–77)
Platelets: 267 10*3/uL (ref 150–400)
RDW: 13.6 % (ref 11.5–15.5)

## 2012-08-10 LAB — POCT WET PREP (WET MOUNT)
Trichomonas Wet Prep HPF POC: NEGATIVE
WBC, Wet Prep HPF POC: NEGATIVE

## 2012-08-10 LAB — POCT URINALYSIS DIPSTICK
Bilirubin, UA: NEGATIVE
Blood, UA: NEGATIVE
Glucose, UA: NEGATIVE
Ketones, UA: NEGATIVE
pH, UA: 5

## 2012-08-10 LAB — CULTURE, OB URINE: Colony Count: 45000
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
## 2012-08-10 NOTE — Assessment & Plan Note (Signed)
Balanced diet Increase fluids esp. Water

## 2012-08-10 NOTE — Progress Notes (Signed)
[redacted]w[redacted]d No Genetic test  Last pap Aug 2013 LMP 05-08-2012 No complaints Had GC/[facility] done Yesterday by urine

## 2012-08-11 LAB — PAP IG W/ RFLX HPV ASCU

## 2012-08-11 LAB — HEMOGLOBINOPATHY EVALUATION
Hemoglobin Other: 0 %
Hgb A2 Quant: 2.6 % (ref 2.2–3.2)
Hgb A: 97.4 % (ref 96.8–97.8)
Hgb F Quant: 0 % (ref 0.0–2.0)

## 2012-08-11 LAB — GC/CHLAMYDIA PROBE AMP, URINE: GC Probe Amp, Urine: NEGATIVE

## 2012-08-11 NOTE — Progress Notes (Signed)
NOB interview completed.  PNV samples given.  NOB work up w/ CA on 08/10/12.

## 2012-11-02 ENCOUNTER — Inpatient Hospital Stay (HOSPITAL_COMMUNITY)
Admission: AD | Admit: 2012-11-02 | Discharge: 2012-11-02 | Disposition: A | Discharge status: Home / Self Care | Payer: Medicaid Other | Source: Ambulatory Visit | Attending: Obstetrics and Gynecology | Admitting: Obstetrics and Gynecology

## 2012-11-02 ENCOUNTER — Encounter (HOSPITAL_COMMUNITY): Payer: Self-pay | Admitting: *Deleted

## 2012-11-02 DIAGNOSIS — R109 Unspecified abdominal pain: Secondary | ICD-10-CM | POA: Insufficient documentation

## 2012-11-02 DIAGNOSIS — O4700 False labor before 37 completed weeks of gestation, unspecified trimester: Secondary | ICD-10-CM | POA: Insufficient documentation

## 2012-11-02 LAB — URINALYSIS, ROUTINE W REFLEX MICROSCOPIC
Bilirubin Urine: NEGATIVE
Glucose, UA: NEGATIVE mg/dL
Hgb urine dipstick: NEGATIVE
Ketones, ur: NEGATIVE mg/dL
Nitrite: NEGATIVE
Protein, ur: NEGATIVE mg/dL
Specific Gravity, Urine: 1.01 (ref 1.005–1.030)
Urobilinogen, UA: 0.2 mg/dL (ref 0.0–1.0)
pH: 6 (ref 5.0–8.0)

## 2012-11-02 LAB — WET PREP, GENITAL
Clue Cells Wet Prep HPF POC: NONE SEEN
Trich, Wet Prep: NONE SEEN
Yeast Wet Prep HPF POC: NONE SEEN

## 2012-11-02 LAB — FETAL FIBRONECTIN: Fetal Fibronectin: NEGATIVE

## 2012-11-02 NOTE — MAU Provider Note (Signed)
History   27yo, G2P0010 at [redacted]w[redacted]d presented with lower abdominal pain/cramping and lower back "butt" pain.  She c/o of these issues at the office 5/16 but states the pain has gotten worse today.  Denies VB, UCs, LOF, recent fever, resp or GI c/o's, UTI or PIH s/s.   Chief Complaint  Patient presents with  . Abdominal Pain    OB History   Grav Para Term Preterm Abortions TAB SAB Ect Mult Living   2    1  1    0      Past Medical History  Diagnosis Date  . Abnormal Pap smear 2009    Colpo done;was normal ;Last pap 01/2012;was normal  . Hypertension     not on meds; off 9 months ago, taken off by doctor.  . Infection     Yeast x 3  . Infection     BV x 1  . Infection     UTI;may get frequently  . Fibroid     Past Surgical History  Procedure Laterality Date  . Mouth surgery  2013    tooth pulled surgically    Family History  Problem Relation Age of Onset  . Fibroids Mother   . Miscarriages / [place] Mother     2 ectopics and 1 miscarriage  . Diabetes Father 48    Started w/ insulin;now oral meds only  . Diabetes Maternal Aunt     x2  . Hypertension Maternal Aunt     x2  . Hypertension Maternal Grandmother   . Diabetes Maternal Grandmother   . Hepatitis C Maternal Grandmother   . Other Mother     Several blood transfusions d/t fibroids  . Hypertension Father   . Asthma Maternal Grandmother   . Asthma Maternal Aunt   . Arthritis Maternal Grandmother     History  Substance Use Topics  . Smoking status: Former Smoker    Types: Cigarettes    Quit date: 06/11/2012  . Smokeless tobacco: Not on file  . Alcohol Use: Yes     Comment: socially- last use prior to pregnancy    Allergies:  Allergies  Allergen Reactions  . Penicillins Hives and Itching    Prescriptions prior to admission  Medication Sig Dispense Refill  . acetaminophen (TYLENOL) 500 MG tablet Take 500 mg by mouth every 6 (six) hours as needed for pain (lower back, thigh pain).      .  Pediatric Multivit-Minerals-C (FLINTSTONES GUMMIES PO) Take 2 tablets by mouth daily.      . [DISCONTINUED] metroNIDAZOLE (FLAGYL) 500 MG tablet Take 1 tablet (500 mg total) by mouth 2 (two) times daily.  14 tablet  0    ROS: see HPI above, all other systems are negative  Physical Exam   Blood pressure 111/67, pulse 89, resp. rate 18, height 5\' 7"  (1.702 m), weight 178 lb 8 oz (80.967 kg), last menstrual period 05/08/2012.  Chest: Clear Heart: RRR Abdomen: gravid, NT Extremities: WNL  Pelvic: Dilation: Closed Effacement (%): Thick Cervical Position: Posterior Presentation: Undeterminable Exam by:: J Vesper Trant CNM   FHT: reassuring for GA UCs: none tracing  Wet prep: neg FFN: neg  ED Course  IUP at [redacted]w[redacted]d PTL vs RL pain and sciatic pain Currently treating sx with maternity belt and Tylenol  D/c home with PTL precautions F/u this week in the office to discuss further POC for abdominal and back pain Comfort measures discussed    Haroldine Laws CNM, MSN 11/02/2012 6:03 PM

## 2013-01-21 LAB — OB RESULTS CONSOLE GBS: GBS: NEGATIVE

## 2013-01-24 ENCOUNTER — Inpatient Hospital Stay (HOSPITAL_COMMUNITY): Payer: Medicaid Other

## 2013-01-24 ENCOUNTER — Inpatient Hospital Stay (HOSPITAL_COMMUNITY)
Admission: AD | Admit: 2013-01-24 | Discharge: 2013-01-24 | Disposition: A | Discharge status: Home / Self Care | Payer: Medicaid Other | Source: Ambulatory Visit | Attending: Obstetrics and Gynecology | Admitting: Obstetrics and Gynecology

## 2013-01-24 ENCOUNTER — Encounter (HOSPITAL_COMMUNITY): Payer: Self-pay | Admitting: *Deleted

## 2013-01-24 DIAGNOSIS — R42 Dizziness and giddiness: Secondary | ICD-10-CM | POA: Insufficient documentation

## 2013-01-24 DIAGNOSIS — O99891 Other specified diseases and conditions complicating pregnancy: Secondary | ICD-10-CM | POA: Insufficient documentation

## 2013-01-24 LAB — CBC WITH DIFFERENTIAL/PLATELET
Eosinophils Absolute: 0.1 10*3/uL (ref 0.0–0.7)
Hemoglobin: 11 g/dL — ABNORMAL LOW (ref 12.0–15.0)
Lymphs Abs: 1.8 10*3/uL (ref 0.7–4.0)
Monocytes Relative: 12 % (ref 3–12)
Neutro Abs: 6.7 10*3/uL (ref 1.7–7.7)
Neutrophils Relative %: 69 % (ref 43–77)
Platelets: 234 10*3/uL (ref 150–400)
RBC: 3.88 MIL/uL (ref 3.87–5.11)
WBC: 9.7 10*3/uL (ref 4.0–10.5)

## 2013-01-24 LAB — COMPREHENSIVE METABOLIC PANEL
ALT: 18 U/L (ref 0–35)
Albumin: 2.7 g/dL — ABNORMAL LOW (ref 3.5–5.2)
Alkaline Phosphatase: 128 U/L — ABNORMAL HIGH (ref 39–117)
BUN: 6 mg/dL (ref 6–23)
Chloride: 101 mEq/L (ref 96–112)
GFR calc Af Amer: >90 mL/min (ref >90)
Glucose, Bld: 90 mg/dL (ref 70–99)
Potassium: 4.2 mEq/L (ref 3.5–5.1)
Sodium: 132 mEq/L — ABNORMAL LOW (ref 135–145)
Total Bilirubin: 0.4 mg/dL (ref 0.3–1.2)
Total Protein: 6.8 g/dL (ref 6.0–8.3)

## 2013-01-24 NOTE — MAU Note (Signed)
Pt states she never lost consciousness this morning.

## 2013-01-24 NOTE — MAU Provider Note (Signed)
History   27yo G2P0010 at [redacted]w[redacted]d presents d/t episodes of dizziness and "seeing stars."  This has been an issue for most of the pregnancy but this morning it was worse.  Also experienced nausea but no emesis.  Pt reports eating well and staying hydrated.  Pt states she is currently taking Fe QD for anemia.  Denies VB, LOF, recent fever, resp or GI c/o's, UTI or PIH s/s. GFM.   Chief Complaint  Patient presents with  . Fatigue    OB History   Grav Para Term Preterm Abortions TAB SAB Ect Mult Living   2    1  1    0      Past Medical History  Diagnosis Date  . Infection     Yeast x 3  . Infection     BV x 1  . Infection     UTI;may get frequently  . Abnormal Pap smear 2009    Past Surgical History  Procedure Laterality Date  . Mouth surgery  2013    tooth pulled surgically    Family History  Problem Relation Age of Onset  . Fibroids Mother   . Miscarriages / [place] Mother     2 ectopics and 1 miscarriage  . Diabetes Father 27    Started w/ insulin;now oral meds only  . Diabetes Maternal Aunt     x2  . Hypertension Maternal Aunt     x2  . Hypertension Maternal Grandmother   . Diabetes Maternal Grandmother   . Hepatitis C Maternal Grandmother   . Other Mother     Several blood transfusions d/t fibroids  . Hypertension Father   . Asthma Maternal Grandmother   . Asthma Maternal Aunt   . Arthritis Maternal Grandmother     History  Substance Use Topics  . Smoking status: Former Smoker    Types: Cigarettes    Quit date: 06/11/2012  . Smokeless tobacco: Not on file  . Alcohol Use: Yes     Comment: socially- last use prior to pregnancy    Allergies:  Allergies  Allergen Reactions  . Penicillins Hives and Itching    Prescriptions prior to admission  Medication Sig Dispense Refill  . acetaminophen (TYLENOL) 500 MG tablet Take 500 mg by mouth every 6 (six) hours as needed for pain (lower back, thigh pain).      . Pediatric Multivit-Minerals-C (FLINTSTONES  GUMMIES PO) Take 2 tablets by mouth daily.        ROS: see HPI above, all other systems are negative   Physical Exam   Blood pressure 118/75, pulse 102, temperature 98.2 F (36.8 C), temperature source Oral, resp. rate 18, last menstrual period 05/08/2012, SpO2 98.00%.  Chest: Clear Heart: RRR Abdomen: gravid, NT Extremities: WNL  Dilation: Fingertip Effacement (%): 50 Cervical Position: Posterior Station: -3 Exam by:: J. Felipa Laroche CNM  FHT: Reassuring but not reactive UCs: Q 3-6 @ admission   Results for orders placed during the hospital encounter of 01/24/13 (from the past 24 hour(s))  CBC WITH DIFFERENTIAL     Status: Abnormal   Collection Time    01/24/13  2:28 PM      Result Value Range   WBC 9.7  4.0 - 10.5 K/uL   RBC 3.88  3.87 - 5.11 MIL/uL   Hemoglobin 11.0 (*) 12.0 - 15.0 g/dL   HCT 16.1 (*) 09.6 - 04.5 %   MCV 84.8  78.0 - 100.0 fL   MCH 28.4  26.0 - 34.0 pg  MCHC 33.4  30.0 - 36.0 g/dL   RDW 16.1  09.6 - 04.5 %   Platelets 234  150 - 400 K/uL   Neutrophils Relative % 69  43 - 77 %   Neutro Abs 6.7  1.7 - 7.7 K/uL   Lymphocytes Relative 18  12 - 46 %   Lymphs Abs 1.8  0.7 - 4.0 K/uL   Monocytes Relative 12  3 - 12 %   Monocytes Absolute 1.1 (*) 0.1 - 1.0 K/uL   Eosinophils Relative 1  0 - 5 %   Eosinophils Absolute 0.1  0.0 - 0.7 K/uL   Basophils Relative 0  0 - 1 %   Basophils Absolute 0.0  0.0 - 0.1 K/uL  COMPREHENSIVE METABOLIC PANEL     Status: Abnormal   Collection Time    01/24/13  2:28 PM      Result Value Range   Sodium 132 (*) 135 - 145 mEq/L   Potassium 4.2  3.5 - 5.1 mEq/L   Chloride 101  96 - 112 mEq/L   CO2 21  19 - 32 mEq/L   Glucose, Bld 90  70 - 99 mg/dL   BUN 6  6 - 23 mg/dL   Creatinine, Ser 4.09  0.50 - 1.10 mg/dL   Calcium 9.3  8.4 - 81.1 mg/dL   Total Protein 6.8  6.0 - 8.3 g/dL   Albumin 2.7 (*) 3.5 - 5.2 g/dL   AST 22  0 - 37 U/L   ALT 18  0 - 35 U/L   Alkaline Phosphatase 128 (*) 39 - 117 U/L   Total Bilirubin 0.4  0.3 -  1.2 mg/dL   GFR calc non Af Amer >90  >90 mL/min   GFR calc Af Amer >90  >90 mL/min    ED Course  IUP at [redacted]w[redacted]d Dizziness H/o anemia  CBC CMET Hgb 11.0  BPP 8/10 (2 off for breathing)  D/c home with labor precautions Next appointment at [facility] is 8/14   Haroldine Laws CNM, MSN 01/24/2013 1:37 PM

## 2013-01-24 NOTE — MAU Note (Signed)
Feeling faint today, has had nausea this a.m., no vomitting.  States she had full breakfast & water.  C/O lower abd, pelvic & back pain.  Denies bleeding or LOF.

## 2013-01-28 ENCOUNTER — Encounter (HOSPITAL_COMMUNITY): Payer: Self-pay

## 2013-01-28 ENCOUNTER — Inpatient Hospital Stay (HOSPITAL_COMMUNITY)
Admission: AD | Admit: 2013-01-28 | Discharge: 2013-01-28 | Disposition: A | Discharge status: Home / Self Care | Payer: Medicaid Other | Source: Ambulatory Visit | Attending: Obstetrics and Gynecology | Admitting: Obstetrics and Gynecology

## 2013-01-28 DIAGNOSIS — O479 False labor, unspecified: Secondary | ICD-10-CM | POA: Insufficient documentation

## 2013-01-28 HISTORY — DX: Anemia, unspecified: D64.9

## 2013-01-28 MED ORDER — ZOLPIDEM TARTRATE 5 MG PO TABS
5.0000 mg | ORAL_TABLET | Freq: Once | ORAL | Status: AC
  Administered 2013-01-28: 5 mg via ORAL
  Filled 2013-01-28: qty 1

## 2013-01-28 MED ORDER — OXYCODONE-ACETAMINOPHEN 5-325 MG PO TABS
2.0000 | ORAL_TABLET | Freq: Once | ORAL | Status: AC
  Administered 2013-01-28: 2 via ORAL
  Filled 2013-01-28: qty 2

## 2013-01-28 NOTE — Progress Notes (Signed)
CNM reviewed efm tracing/discussed, ok for pt to walk x1hour and recheck cervix

## 2013-01-28 NOTE — Progress Notes (Signed)
ASowder, RN answered phone for MSmith, CNM who was in delivery, notified pt here for labor eval, okay for RN to perform cervical exam and cb with results.

## 2013-01-28 NOTE — MAU Note (Signed)
Pt states here for contractions, seem more intense when pt is walking or lying on her r side, denies bleeding or lof. Cervix 0.5cm/50% on Thursday in office. Since 2030 last pm, has noted more intense ctx's, called CNM who told her to come for eval when pain was more intense.

## 2013-01-28 NOTE — MAU Note (Signed)
Pt reports having contractions 2-3 min apart. Since 1030 last night. Denies SROM or bleeding and good fetal movement reported.

## 2013-01-28 NOTE — MAU Provider Note (Signed)
History    CSN: [id number]  Arrival date and time: 01/28/13 4098   First Provider Initiated Contact with Patient 01/28/13 1223     Chief Complaint  Patient presents with  . Labor Eval   HPI Pt presents to MAU with c/o of regular painful contractions since last evening which increased in frequency and severity earlier this AM.  Denies ROM or bldg.  Reports active fetus.  SVE by RN upon arrival.  Pt ambulated x 1hr.  Repeat SVE after ambulation with slight change in cervix.  After resting upon return from walking, pt reports decreased contraction frequency and decreased intensity.   OB History   Grav Para Term Preterm Abortions TAB SAB Ect Mult Living   2    1  1    0      Past Medical History  Diagnosis Date  . Infection     Yeast x 3  . Infection     BV x 1  . Infection     UTI;may get frequently  . Abnormal Pap smear 2009  . Anemia     Past Surgical History  Procedure Laterality Date  . Mouth surgery  2013    tooth pulled surgically    Family History  Problem Relation Age of Onset  . Fibroids Mother   . Miscarriages / [place] Mother     2 ectopics and 1 miscarriage  . Diabetes Father 55    Started w/ insulin;now oral meds only  . Diabetes Maternal Aunt     x2  . Hypertension Maternal Aunt     x2  . Hypertension Maternal Grandmother   . Diabetes Maternal Grandmother   . Hepatitis C Maternal Grandmother   . Other Mother     Several blood transfusions d/t fibroids  . Hypertension Father   . Asthma Maternal Grandmother   . Asthma Maternal Aunt   . Arthritis Maternal Grandmother     History  Substance Use Topics  . Smoking status: Former Smoker    Types: Cigarettes    Quit date: 06/11/2012  . Smokeless tobacco: Never Used  . Alcohol Use: No     Comment: socially- last use prior to pregnancy    Allergies: No Known Allergies  Prescriptions prior to admission  Medication Sig Dispense Refill  . IRON PO Take 1 tablet by mouth daily.      Marland Kitchen  nystatin ointment (MYCOSTATIN) Apply 1 application topically 2 (two) times daily.      . Pediatric Multivit-Minerals-C (FLINTSTONES GUMMIES PO) Take 2 tablets by mouth daily.        Review of Systems  Constitutional: Negative.   HENT: Negative.   Eyes: Negative.   Respiratory: Negative.   Cardiovascular: Negative.   Gastrointestinal: Negative.   Genitourinary: Negative.   Musculoskeletal: Negative.   Skin: Negative.   Neurological: Negative.   Endo/Heme/Allergies: Negative.   Psychiatric/Behavioral: Negative.    Physical Exam   Blood pressure 133/79, pulse 103, temperature 97.8 F (36.6 C), temperature source Oral, resp. rate 20, height 5\' 7"  (1.702 m), weight 92.08 kg (203 lb), last menstrual period 05/08/2012.  Physical Exam  Constitutional: She is oriented to person, place, and time. She appears well-developed and well-nourished.  HENT:  Head: Normocephalic and atraumatic.  Right Ear: External ear normal.  Left Ear: External ear normal.  Nose: Nose normal.  Eyes: Conjunctivae are normal. Pupils are equal, round, and reactive to light.  Neck: Normal range of motion. Neck supple.  Cardiovascular: Normal rate, regular rhythm and  intact distal pulses.   Respiratory: Effort normal and breath sounds normal.  GI: Soft. Bowel sounds are normal. There is no tenderness. There is no rebound and no guarding.  Gravid.  Genitourinary:  Ext genitalia WNL.  BUS neg.  Speculum exam deferred.  Musculoskeletal: Normal range of motion.  Neurological: She is alert and oriented to person, place, and time. She has normal reflexes.  Skin: Skin is warm and dry.  Psychiatric: She has a normal mood and affect. Her behavior is normal.   FHR baseline 135.  Variability moderate.  Accels-present.  Decels-absent at present.  FHR Cat 1 Toco: UCs every 1.5-6 mins at present.    SVE on admission by RN 0.5/80%/-3.  SVE after walking 1.5/50%/-2/vtx/posterior.  Repeat SVE 1.5hrs after return from walking  without change, 1.5/50%/-3/vtx MAU Course  Procedures  Assessment and Plan  IUP at 37w 6d False labor  Discharged to home. RBA meds for therapeutic rest d/w pt and desires to use.  Pt given Percocet 5/325mg , 2 tabs and Ambien 5mg  1 tab at discharge with instructions reviewed. Revd fetal kick counts as well as s/s labor. RTO on Thursday, February 02, 2013 as sched for f/u.   Ahlaya Ende O. 01/28/2013, 12:44 PM

## 2013-01-31 ENCOUNTER — Encounter (HOSPITAL_COMMUNITY): Payer: Self-pay | Admitting: *Deleted

## 2013-01-31 ENCOUNTER — Inpatient Hospital Stay (HOSPITAL_COMMUNITY)
Admission: AD | Admit: 2013-01-31 | Discharge: 2013-01-31 | Disposition: A | Discharge status: Home / Self Care | Payer: Medicaid Other | Source: Ambulatory Visit | Attending: Obstetrics and Gynecology | Admitting: Obstetrics and Gynecology

## 2013-01-31 DIAGNOSIS — O99891 Other specified diseases and conditions complicating pregnancy: Secondary | ICD-10-CM | POA: Insufficient documentation

## 2013-01-31 DIAGNOSIS — O479 False labor, unspecified: Secondary | ICD-10-CM | POA: Insufficient documentation

## 2013-01-31 LAB — AMNISURE RUPTURE OF MEMBRANE (ROM) NOT AT ARMC: Amnisure ROM: NEGATIVE

## 2013-01-31 NOTE — MAU Provider Note (Signed)
History   27 yo G2P0010 at 41 2/7 weeks presented unannounced c/o ? Wetness this am at 6:30am--no leaking since.  Seen 8/16 in MAU for labor check, with cervix 1.5 cm then.  Discharged home.  Denies bleeding, reports +FM.  Patient Active Problem List   Diagnosis Date Noted  . Constipation in pregnancy 08/10/2012  . Fibroid, uterine 08/10/2012     Chief Complaint  Patient presents with  . Rupture of Membranes     OB History   Grav Para Term Preterm Abortions TAB SAB Ect Mult Living   2    1  1    0      Past Medical History  Diagnosis Date  . Infection     Yeast x 3  . Infection     BV x 1  . Infection     UTI;may get frequently  . Abnormal Pap smear 2009  . Anemia     Past Surgical History  Procedure Laterality Date  . Mouth surgery  2013    tooth pulled surgically    Family History  Problem Relation Age of Onset  . Fibroids Mother   . Miscarriages / [place] Mother     2 ectopics and 1 miscarriage  . Diabetes Father 73    Started w/ insulin;now oral meds only  . Diabetes Maternal Aunt     x2  . Hypertension Maternal Aunt     x2  . Hypertension Maternal Grandmother   . Diabetes Maternal Grandmother   . Hepatitis C Maternal Grandmother   . Other Mother     Several blood transfusions d/t fibroids  . Hypertension Father   . Asthma Maternal Grandmother   . Asthma Maternal Aunt   . Arthritis Maternal Grandmother     History  Substance Use Topics  . Smoking status: Former Smoker    Types: Cigarettes    Quit date: 06/11/2012  . Smokeless tobacco: Never Used  . Alcohol Use: No     Comment: socially- last use prior to pregnancy    Allergies: No Known Allergies  Prescriptions prior to admission  Medication Sig Dispense Refill  . IRON PO Take 1 tablet by mouth daily.      Marland Kitchen nystatin ointment (MYCOSTATIN) Apply 1 application topically 2 (two) times daily.      . Pediatric Multivit-Minerals-C (FLINTSTONES GUMMIES PO) Take 2 tablets by mouth daily.          Physical Exam   Blood pressure 124/76, pulse 89, temperature 99.3 F (37.4 C), temperature source Oral, resp. rate 18, height 5\' 8"  (1.727 m), weight 93.895 kg (207 lb), last menstrual period 05/08/2012.  Chest clear Heart RRR without murmur Abd gravid, NT Pelvic--cervix posterior, 1.5 cm, 75%, vtx, -1.  No leaking noted. Ext WNL  FHR Category 1 UCs irregular, q 2-8 min, mostly mild.  Results for orders placed during the hospital encounter of 01/31/13 (from the past 24 hour(s))  AMNISURE RUPTURE OF MEMBRANE (ROM)     Status: None   Collection Time    01/31/13  8:38 AM      Result Value Range   Amnisure ROM NEGATIVE       ED Course  IUP at 38 2/7 weeks No evidence SROM or active labor  D/C'd home with labor precautions. Keep scheduled appt on Thursday at [facility] or call/return prn.    Nigel Bridgeman CNM, [place] 01/31/2013 9:26 AM

## 2013-01-31 NOTE — MAU Note (Signed)
Patient states she had leaking of a little clear fluid at 0630. Not actively leaking at this time. Reports good fetal movement. Now having some contractions.

## 2013-02-02 ENCOUNTER — Inpatient Hospital Stay (HOSPITAL_COMMUNITY): Payer: Medicaid Other | Admitting: Anesthesiology

## 2013-02-02 ENCOUNTER — Inpatient Hospital Stay (HOSPITAL_COMMUNITY)
Admission: AD | Admit: 2013-02-02 | Discharge: 2013-02-06 | DRG: 766 | Disposition: A | Discharge status: Home / Self Care | Payer: Medicaid Other | Source: Ambulatory Visit | Attending: Obstetrics and Gynecology | Admitting: Obstetrics and Gynecology

## 2013-02-02 ENCOUNTER — Encounter (HOSPITAL_COMMUNITY): Payer: Self-pay | Admitting: Anesthesiology

## 2013-02-02 ENCOUNTER — Encounter (HOSPITAL_COMMUNITY): Payer: Self-pay | Admitting: *Deleted

## 2013-02-02 DIAGNOSIS — M545 Low back pain: Secondary | ICD-10-CM | POA: Insufficient documentation

## 2013-02-02 DIAGNOSIS — M5430 Sciatica, unspecified side: Secondary | ICD-10-CM | POA: Insufficient documentation

## 2013-02-02 DIAGNOSIS — O368190 Decreased fetal movements, unspecified trimester, not applicable or unspecified: Secondary | ICD-10-CM | POA: Diagnosis present

## 2013-02-02 DIAGNOSIS — IMO0001 Reserved for inherently not codable concepts without codable children: Secondary | ICD-10-CM

## 2013-02-02 DIAGNOSIS — B372 Candidiasis of skin and nail: Secondary | ICD-10-CM | POA: Insufficient documentation

## 2013-02-02 DIAGNOSIS — D649 Anemia, unspecified: Secondary | ICD-10-CM | POA: Diagnosis not present

## 2013-02-02 DIAGNOSIS — O9903 Anemia complicating the puerperium: Secondary | ICD-10-CM | POA: Diagnosis not present

## 2013-02-02 LAB — CBC
Platelets: 256 10*3/uL (ref 150–400)
RDW: 15.8 % — ABNORMAL HIGH (ref 11.5–15.5)
WBC: 10.8 10*3/uL — ABNORMAL HIGH (ref 4.0–10.5)

## 2013-02-02 MED ORDER — PHENYLEPHRINE 40 MCG/ML (10ML) SYRINGE FOR IV PUSH (FOR BLOOD PRESSURE SUPPORT)
80.0000 ug | PREFILLED_SYRINGE | INTRAVENOUS | Status: DC | PRN
  Filled 2013-02-02: qty 5

## 2013-02-02 MED ORDER — DIPHENHYDRAMINE HCL 50 MG/ML IJ SOLN: 12.5000 mg | INTRAMUSCULAR | Status: DC | PRN

## 2013-02-02 MED ORDER — CITRIC ACID-SODIUM CITRATE 334-500 MG/5ML PO SOLN
30.0000 mL | ORAL | Status: DC | PRN
  Administered 2013-02-03: 30 mL via ORAL
  Filled 2013-02-02 (×2): qty 15

## 2013-02-02 MED ORDER — OXYCODONE-ACETAMINOPHEN 5-325 MG PO TABS: 1.0000 | ORAL_TABLET | ORAL | Status: DC | PRN

## 2013-02-02 MED ORDER — LACTATED RINGERS IV SOLN: 500.0000 mL | INTRAVENOUS | Status: DC | PRN

## 2013-02-02 MED ORDER — EPHEDRINE 5 MG/ML INJ: 10.0000 mg | INTRAVENOUS | Status: DC | PRN

## 2013-02-02 MED ORDER — LACTATED RINGERS IV SOLN
INTRAVENOUS | Status: DC
  Administered 2013-02-02 – 2013-02-03 (×6): via INTRAVENOUS

## 2013-02-02 MED ORDER — LIDOCAINE HCL (PF) 1 % IJ SOLN
30.0000 mL | INTRAMUSCULAR | Status: AC | PRN
  Administered 2013-02-03: 30 mL via SUBCUTANEOUS
  Filled 2013-02-02 (×2): qty 30

## 2013-02-02 MED ORDER — ONDANSETRON HCL 4 MG/2ML IJ SOLN
4.0000 mg | Freq: Four times a day (QID) | INTRAMUSCULAR | Status: DC | PRN
  Administered 2013-02-02: 4 mg via INTRAVENOUS
  Filled 2013-02-02: qty 2

## 2013-02-02 MED ORDER — OXYTOCIN 40 UNITS IN LACTATED RINGERS INFUSION - SIMPLE MED: 62.5000 mL/h | INTRAVENOUS | Status: DC

## 2013-02-02 MED ORDER — FENTANYL 2.5 MCG/ML BUPIVACAINE 1/10 % EPIDURAL INFUSION (WH - ANES)
14.0000 mL/h | INTRAMUSCULAR | Status: DC | PRN
  Administered 2013-02-02 – 2013-02-03 (×3): 14 mL/h via EPIDURAL
  Filled 2013-02-02 (×3): qty 125

## 2013-02-02 MED ORDER — SODIUM BICARBONATE 8.4 % IV SOLN
INTRAVENOUS | Status: DC | PRN
  Administered 2013-02-02 – 2013-02-03 (×2): 5 mL via EPIDURAL
  Administered 2013-02-03 (×2): 3 mL via EPIDURAL
  Administered 2013-02-03: 2 mL via EPIDURAL
  Administered 2013-02-03: 8 mL via EPIDURAL

## 2013-02-02 MED ORDER — OXYTOCIN BOLUS FROM INFUSION: 500.0000 mL | INTRAVENOUS | Status: DC

## 2013-02-02 MED ORDER — ACETAMINOPHEN 325 MG PO TABS: 650.0000 mg | ORAL_TABLET | ORAL | Status: DC | PRN

## 2013-02-02 MED ORDER — LACTATED RINGERS IV SOLN
500.0000 mL | Freq: Once | INTRAVENOUS | Status: AC
  Administered 2013-02-02: 500 mL via INTRAVENOUS

## 2013-02-02 MED ORDER — EPHEDRINE 5 MG/ML INJ
10.0000 mg | INTRAVENOUS | Status: DC | PRN
  Filled 2013-02-02: qty 4

## 2013-02-02 MED ORDER — PHENYLEPHRINE 40 MCG/ML (10ML) SYRINGE FOR IV PUSH (FOR BLOOD PRESSURE SUPPORT): 80.0000 ug | PREFILLED_SYRINGE | INTRAVENOUS | Status: DC | PRN

## 2013-02-02 MED ORDER — IBUPROFEN 600 MG PO TABS: 600.0000 mg | ORAL_TABLET | Freq: Four times a day (QID) | ORAL | Status: DC | PRN

## 2013-02-02 NOTE — MAU Note (Addendum)
PT NOW SAYS SHE WORKS AT [employer] AND AT 130PM  SHE TRIPPED OVER [employer] TOY-  FELL ONTO R HIP- DOES  NOT BELIEVE  SHE HIT HER ABD.    DENIES ANY BLEEDING.  HAS BEEN HAVING UC  EVEN BEFORE SHE FELL.

## 2013-02-02 NOTE — H&P (Signed)
BULAR HICKOK is a 27 y.o. female, G2P0010 at 102w4d, presenting for labor check.  UCs since 1630.  Was checked yesterday in the office and was 1 cm / 70% / -2.  Denies VB, LOF, recent fever, resp or GI c/o's, UTI or PIH s/s. GFM. Desires epidural.  Pt also reported falling this afternoon at work, fell on her right hip, does not believe she fell on her abdomen.  Patient Active Problem List   Diagnosis Date Noted  . Constipation in pregnancy 08/10/2012  . Fibroid, uterine 08/10/2012    History of present pregnancy: Patient entered care at 13 weeks.   EDC of 02/12/13 was established by LMP.   Anatomy scan:  17 weeks, limited with normal findings and an anterior placenta.   Additional [place] evaluations:  At [redacted]w[redacted]d for anatomy f/u - anatomy WNL and scan complete.   Significant prenatal events:  n/a   Last evaluation:  02/01/13 at [redacted]w[redacted]d  OB History   Grav Para Term Preterm Abortions TAB SAB Ect Mult Living   2    1  1    0     Past Medical History  Diagnosis Date  . Abnormal Pap smear 2009  . Anemia   . Infection     Yeast x 3  . Infection     BV x 1  . Infection     UTI;may get frequently   Past Surgical History  Procedure Laterality Date  . Mouth surgery  2013    tooth pulled surgically   Family History: family history includes Arthritis in her maternal grandmother; Asthma in her maternal aunt and maternal grandmother; Diabetes in her maternal aunt and maternal grandmother; Diabetes (age of onset: 61) in her father; Fibroids in her mother; Hepatitis C in her maternal grandmother; Hypertension in her father, maternal aunt, and maternal grandmother; Miscarriages / [place] in her mother; Other in her mother. Social History:  reports that she quit smoking about 7 months ago. Her smoking use included Cigarettes. She smoked 0.00 packs per day. She has never used smokeless tobacco. She reports that she does not drink alcohol or use illicit drugs.   Prenatal Transfer Tool  Maternal  Diabetes: No Genetic Screening: Declined Maternal Ultrasounds/Referrals: Normal Fetal Ultrasounds or other Referrals:  None Maternal Substance Abuse:  No Significant Maternal Medications:  None Significant Maternal Lab Results: Lab values include: Group B Strep negative    ROS: see HPI above, all other systems are negative  No Known Allergies   Dilation: 3 Effacement (%): 80 Station: -2 Exam by:: DCALLAWAY, RN Blood pressure 122/80, pulse 89, temperature 98.9 F (37.2 C), temperature source Oral, last menstrual period 05/08/2012.  Chest clear Heart RRR without murmur Abd gravid, NT Ext: WNL  FHR: Reactive NST UCs: Q 2-5 min  Prenatal labs: ABO, Rh: O/POS/-- (02/25 1201) Antibody: NEG (02/25 1201) Rubella:   Immune RPR: NON REAC (02/25 1201)  HBsAg: NEGATIVE (02/25 1201)  HIV: NON REACTIVE (02/25 1201)  GBS: Negative (08/09 0000) Sickle cell/Hgb electrophoresis:  Normal Study Pap:  08/10/12 WNL GC:  Neg Chlamydia:  Neg Genetic screenings:  Declined  Glucola:  131 Other:  None  Assessment/Plan: IUP at [redacted]w[redacted]d Active labor GBS neg Desires epidural  Admit to BS per c/w Dr. Estanislado Pandy as attending MD Routine [facility] orders Epidural prn  Rowan Blase, MSN 02/02/2013, 8:45 PM

## 2013-02-02 NOTE — MAU Note (Addendum)
PT SAYS SHE HAS BEEN HURTING BAD SINCE 430PM.   WAS 1-2 CM IN OFFICE  YESTERDAY.   DENIES HSV AND MRSA.    GBS- NEG.

## 2013-02-02 NOTE — Anesthesia Procedure Notes (Signed)

## 2013-02-02 NOTE — Anesthesia Preprocedure Evaluation (Signed)

## 2013-02-02 NOTE — Progress Notes (Signed)
  Subjective: Pt sitting up comfortable with epidural.    Objective: BP 117/75  Pulse 115  Temp(Src) 98.9 F (37.2 C) (Oral)  Resp 18  Ht 5\' 7"  (1.702 m)  Wt 208 lb (94.348 kg)  BMI 32.57 kg/m2  LMP 05/08/2012      FHT:  Cat II UC:   regular, every 2-4 minutes  SVE:   Dilation: 6 Effacement (%): 90 Station: -2 Exam by:: Towanna Avery , CNM  Assessment / Plan:  Labor: Active labor; AROM - clear fluid Preeclampsia: no s/s Fetal Wellbeing: Cat II Pain Control: Epidural I/D: GBS neg; AROM at 2300 Anticipated MOD: SVD   Mansel Strother 02/02/2013, 11:12 PM

## 2013-02-03 ENCOUNTER — Encounter (HOSPITAL_COMMUNITY): Payer: Self-pay | Admitting: *Deleted

## 2013-02-03 ENCOUNTER — Encounter (HOSPITAL_COMMUNITY)
Admission: AD | Disposition: A | Discharge status: Home / Self Care | Payer: Self-pay | Source: Ambulatory Visit | Attending: Obstetrics and Gynecology

## 2013-02-03 DIAGNOSIS — D649 Anemia, unspecified: Secondary | ICD-10-CM | POA: Diagnosis present

## 2013-02-03 DIAGNOSIS — IMO0001 Reserved for inherently not codable concepts without codable children: Secondary | ICD-10-CM

## 2013-02-03 LAB — RPR: RPR Ser Ql: NONREACTIVE

## 2013-02-03 SURGERY — Surgical Case
Anesthesia: Epidural | Site: Abdomen | Wound class: Clean Contaminated

## 2013-02-03 MED ORDER — TERBUTALINE SULFATE 1 MG/ML IJ SOLN: 0.2500 mg | Freq: Once | INTRAMUSCULAR | Status: DC | PRN

## 2013-02-03 MED ORDER — MENTHOL 3 MG MT LOZG
1.0000 | LOZENGE | OROMUCOSAL | Status: DC | PRN
  Administered 2013-02-05 (×2): 3 mg via ORAL
  Filled 2013-02-03 (×2): qty 9

## 2013-02-03 MED ORDER — MIDAZOLAM HCL 2 MG/2ML IJ SOLN: 0.5000 mg | Freq: Once | INTRAMUSCULAR | Status: DC | PRN

## 2013-02-03 MED ORDER — KETOROLAC TROMETHAMINE 30 MG/ML IJ SOLN
INTRAMUSCULAR | Status: AC
  Administered 2013-02-03: 30 mg via INTRAVENOUS
  Filled 2013-02-03: qty 1

## 2013-02-03 MED ORDER — SCOPOLAMINE 1 MG/3DAYS TD PT72
1.0000 | MEDICATED_PATCH | Freq: Once | TRANSDERMAL | Status: DC
  Administered 2013-02-03: 1.5 mg via TRANSDERMAL

## 2013-02-03 MED ORDER — ONDANSETRON HCL 4 MG PO TABS: 4.0000 mg | ORAL_TABLET | ORAL | Status: DC | PRN

## 2013-02-03 MED ORDER — ONDANSETRON HCL 4 MG/2ML IJ SOLN: 4.0000 mg | INTRAMUSCULAR | Status: DC | PRN

## 2013-02-03 MED ORDER — FENTANYL CITRATE 0.05 MG/ML IJ SOLN
INTRAMUSCULAR | Status: AC
  Filled 2013-02-03: qty 2

## 2013-02-03 MED ORDER — NALOXONE HCL 0.4 MG/ML IJ SOLN: 0.4000 mg | INTRAMUSCULAR | Status: DC | PRN

## 2013-02-03 MED ORDER — TETANUS-DIPHTH-ACELL PERTUSSIS 5-2.5-18.5 LF-MCG/0.5 IM SUSP: 0.5000 mL | Freq: Once | INTRAMUSCULAR | Status: DC

## 2013-02-03 MED ORDER — MORPHINE SULFATE (PF) 0.5 MG/ML IJ SOLN
INTRAMUSCULAR | Status: DC | PRN
  Administered 2013-02-03: 1000 ug via EPIDURAL
  Administered 2013-02-03: 4000 ug via INTRAVENOUS

## 2013-02-03 MED ORDER — SODIUM BICARBONATE 8.4 % IV SOLN
INTRAVENOUS | Status: AC
  Filled 2013-02-03: qty 50

## 2013-02-03 MED ORDER — IBUPROFEN 600 MG PO TABS: 600.0000 mg | ORAL_TABLET | Freq: Four times a day (QID) | ORAL | Status: DC

## 2013-02-03 MED ORDER — LACTATED RINGERS IV SOLN
INTRAVENOUS | Status: DC | PRN
  Administered 2013-02-03: 16:00:00 via INTRAVENOUS

## 2013-02-03 MED ORDER — FENTANYL CITRATE 0.05 MG/ML IJ SOLN
25.0000 ug | INTRAMUSCULAR | Status: DC | PRN
  Administered 2013-02-03: 50 ug via INTRAVENOUS

## 2013-02-03 MED ORDER — SCOPOLAMINE 1 MG/3DAYS TD PT72
MEDICATED_PATCH | TRANSDERMAL | Status: AC
  Filled 2013-02-03: qty 1

## 2013-02-03 MED ORDER — LACTATED RINGERS IV SOLN
INTRAVENOUS | Status: DC
  Administered 2013-02-04: via INTRAVENOUS

## 2013-02-03 MED ORDER — MEPERIDINE HCL 25 MG/ML IJ SOLN: 6.2500 mg | INTRAMUSCULAR | Status: DC | PRN

## 2013-02-03 MED ORDER — ONDANSETRON HCL 4 MG/2ML IJ SOLN
INTRAMUSCULAR | Status: AC
  Filled 2013-02-03: qty 2

## 2013-02-03 MED ORDER — DIPHENHYDRAMINE HCL 50 MG/ML IJ SOLN: 12.5000 mg | INTRAMUSCULAR | Status: DC | PRN

## 2013-02-03 MED ORDER — SODIUM CHLORIDE 0.9 % IJ SOLN: 3.0000 mL | INTRAMUSCULAR | Status: DC | PRN

## 2013-02-03 MED ORDER — OXYTOCIN 10 UNIT/ML IJ SOLN
INTRAMUSCULAR | Status: AC
  Filled 2013-02-03: qty 4

## 2013-02-03 MED ORDER — PRENATAL MULTIVITAMIN CH
1.0000 | ORAL_TABLET | Freq: Every day | ORAL | Status: DC
  Administered 2013-02-04 – 2013-02-05 (×2): 1 via ORAL
  Filled 2013-02-03 (×2): qty 1

## 2013-02-03 MED ORDER — NALBUPHINE HCL 10 MG/ML IJ SOLN: 5.0000 mg | INTRAMUSCULAR | Status: DC | PRN

## 2013-02-03 MED ORDER — KETOROLAC TROMETHAMINE 30 MG/ML IJ SOLN: 30.0000 mg | Freq: Four times a day (QID) | INTRAMUSCULAR | Status: AC | PRN

## 2013-02-03 MED ORDER — DIBUCAINE 1 % RE OINT: 1.0000 "application " | TOPICAL_OINTMENT | RECTAL | Status: DC | PRN

## 2013-02-03 MED ORDER — SIMETHICONE 80 MG PO CHEW: 80.0000 mg | CHEWABLE_TABLET | ORAL | Status: DC | PRN

## 2013-02-03 MED ORDER — PHENYLEPHRINE HCL 10 MG/ML IJ SOLN
INTRAMUSCULAR | Status: AC
  Filled 2013-02-03: qty 1

## 2013-02-03 MED ORDER — EPHEDRINE 5 MG/ML INJ
INTRAVENOUS | Status: AC
  Filled 2013-02-03: qty 10

## 2013-02-03 MED ORDER — OXYTOCIN 40 UNITS IN LACTATED RINGERS INFUSION - SIMPLE MED
1.0000 m[IU]/min | INTRAVENOUS | Status: DC
  Administered 2013-02-03: 1 m[IU]/min via INTRAVENOUS
  Filled 2013-02-03: qty 1000

## 2013-02-03 MED ORDER — OXYCODONE-ACETAMINOPHEN 5-325 MG PO TABS
1.0000 | ORAL_TABLET | ORAL | Status: DC | PRN
  Administered 2013-02-04 – 2013-02-06 (×9): 1 via ORAL
  Filled 2013-02-03 (×10): qty 1

## 2013-02-03 MED ORDER — OXYTOCIN 10 UNIT/ML IJ SOLN
40.0000 [IU] | INTRAVENOUS | Status: DC | PRN
  Administered 2013-02-03: 40 [IU] via INTRAVENOUS

## 2013-02-03 MED ORDER — PHENYLEPHRINE 40 MCG/ML (10ML) SYRINGE FOR IV PUSH (FOR BLOOD PRESSURE SUPPORT)
PREFILLED_SYRINGE | INTRAVENOUS | Status: AC
  Filled 2013-02-03: qty 5

## 2013-02-03 MED ORDER — LANOLIN HYDROUS EX OINT: 1.0000 "application " | TOPICAL_OINTMENT | CUTANEOUS | Status: DC | PRN

## 2013-02-03 MED ORDER — PROMETHAZINE HCL 25 MG/ML IJ SOLN: 6.2500 mg | INTRAMUSCULAR | Status: DC | PRN

## 2013-02-03 MED ORDER — DIPHENHYDRAMINE HCL 25 MG PO CAPS: 25.0000 mg | ORAL_CAPSULE | ORAL | Status: DC | PRN

## 2013-02-03 MED ORDER — BUPIVACAINE HCL (PF) 0.25 % IJ SOLN
INTRAMUSCULAR | Status: DC | PRN
  Administered 2013-02-03: 10 mL

## 2013-02-03 MED ORDER — METOCLOPRAMIDE HCL 5 MG/ML IJ SOLN: 10.0000 mg | Freq: Three times a day (TID) | INTRAMUSCULAR | Status: DC | PRN

## 2013-02-03 MED ORDER — MORPHINE SULFATE 0.5 MG/ML IJ SOLN
INTRAMUSCULAR | Status: AC
  Filled 2013-02-03: qty 10

## 2013-02-03 MED ORDER — LIDOCAINE HCL (CARDIAC) 20 MG/ML IV SOLN
INTRAVENOUS | Status: AC
  Filled 2013-02-03: qty 5

## 2013-02-03 MED ORDER — DIPHENHYDRAMINE HCL 50 MG/ML IJ SOLN: 25.0000 mg | INTRAMUSCULAR | Status: DC | PRN

## 2013-02-03 MED ORDER — BUPIVACAINE HCL (PF) 0.25 % IJ SOLN
INTRAMUSCULAR | Status: AC
  Filled 2013-02-03: qty 30

## 2013-02-03 MED ORDER — DIPHENHYDRAMINE HCL 25 MG PO CAPS: 25.0000 mg | ORAL_CAPSULE | Freq: Four times a day (QID) | ORAL | Status: DC | PRN

## 2013-02-03 MED ORDER — KETOROLAC TROMETHAMINE 30 MG/ML IJ SOLN
30.0000 mg | Freq: Four times a day (QID) | INTRAMUSCULAR | Status: AC | PRN
  Administered 2013-02-04: 30 mg via INTRAVENOUS
  Filled 2013-02-03: qty 1

## 2013-02-03 MED ORDER — BUPIVACAINE-EPINEPHRINE (PF) 0.5% -1:200000 IJ SOLN
INTRAMUSCULAR | Status: AC
  Filled 2013-02-03: qty 10

## 2013-02-03 MED ORDER — WITCH HAZEL-GLYCERIN EX PADS: 1.0000 "application " | MEDICATED_PAD | CUTANEOUS | Status: DC | PRN

## 2013-02-03 MED ORDER — SIMETHICONE 80 MG PO CHEW
80.0000 mg | CHEWABLE_TABLET | Freq: Three times a day (TID) | ORAL | Status: DC
  Administered 2013-02-04 – 2013-02-06 (×8): 80 mg via ORAL

## 2013-02-03 MED ORDER — 0.9 % SODIUM CHLORIDE (POUR BTL) OPTIME
TOPICAL | Status: DC | PRN
  Administered 2013-02-03 (×2): 1000 mL

## 2013-02-03 MED ORDER — OXYTOCIN 40 UNITS IN LACTATED RINGERS INFUSION - SIMPLE MED: 62.5000 mL/h | INTRAVENOUS | Status: AC

## 2013-02-03 MED ORDER — PHENYLEPHRINE HCL 10 MG/ML IJ SOLN
10.0000 mg | INTRAVENOUS | Status: DC | PRN
  Administered 2013-02-03: 40 ug/min via INTRAVENOUS

## 2013-02-03 MED ORDER — ONDANSETRON HCL 4 MG/2ML IJ SOLN
INTRAMUSCULAR | Status: DC | PRN
  Administered 2013-02-03: 4 mg via INTRAVENOUS

## 2013-02-03 MED ORDER — CEFAZOLIN SODIUM-DEXTROSE 2-3 GM-% IV SOLR
2.0000 g | Freq: Once | INTRAVENOUS | Status: AC
  Administered 2013-02-03: 2 g via INTRAVENOUS
  Filled 2013-02-03: qty 50

## 2013-02-03 MED ORDER — SENNOSIDES-DOCUSATE SODIUM 8.6-50 MG PO TABS
2.0000 | ORAL_TABLET | Freq: Every day | ORAL | Status: DC
  Administered 2013-02-04 – 2013-02-05 (×2): 2 via ORAL

## 2013-02-03 MED ORDER — IBUPROFEN 600 MG PO TABS
600.0000 mg | ORAL_TABLET | Freq: Four times a day (QID) | ORAL | Status: DC
  Administered 2013-02-04 – 2013-02-06 (×9): 600 mg via ORAL
  Filled 2013-02-03 (×9): qty 1

## 2013-02-03 MED ORDER — DEXTROSE 5 % IV SOLN: 1.0000 ug/kg/h | INTRAVENOUS | Status: DC | PRN

## 2013-02-03 MED ORDER — PHENYLEPHRINE HCL 10 MG/ML IJ SOLN
INTRAMUSCULAR | Status: DC | PRN
  Administered 2013-02-03 (×2): 80 ug via INTRAVENOUS
  Administered 2013-02-03: 40 ug via INTRAVENOUS
  Administered 2013-02-03: 80 ug via INTRAVENOUS
  Administered 2013-02-03 (×3): 40 ug via INTRAVENOUS
  Administered 2013-02-03: 80 ug via INTRAVENOUS

## 2013-02-03 MED ORDER — ZOLPIDEM TARTRATE 5 MG PO TABS: 5.0000 mg | ORAL_TABLET | Freq: Every evening | ORAL | Status: DC | PRN

## 2013-02-03 MED ORDER — ONDANSETRON HCL 4 MG/2ML IJ SOLN
4.0000 mg | Freq: Three times a day (TID) | INTRAMUSCULAR | Status: DC | PRN
  Filled 2013-02-03: qty 2

## 2013-02-03 MED ORDER — CEFAZOLIN SODIUM-DEXTROSE 2-3 GM-% IV SOLR
INTRAVENOUS | Status: DC | PRN
  Administered 2013-02-03: 2 g via INTRAVENOUS

## 2013-02-03 SURGICAL SUPPLY — 51 items
ADH SKN CLS APL DERMABOND .7 (GAUZE/BANDAGES/DRESSINGS)
APL SKNCLS STERI-STRIP NONHPOA (GAUZE/BANDAGES/DRESSINGS)
BENZOIN TINCTURE PRP APPL 2/3 (GAUZE/BANDAGES/DRESSINGS) IMPLANT
BLADE EXTENDED COATED 6.5IN (ELECTRODE) IMPLANT
BLADE HEX COATED 2.75 (ELECTRODE) IMPLANT
BOOTIES KNEE HIGH SLOAN (MISCELLANEOUS) ×4 IMPLANT
CLAMP CORD UMBIL (MISCELLANEOUS) IMPLANT
CLOTH BEACON ORANGE TIMEOUT ST (SAFETY) ×2 IMPLANT
CONTAINER PREFILL 10% NBF 15ML (MISCELLANEOUS) ×4 IMPLANT
DERMABOND ADVANCED (GAUZE/BANDAGES/DRESSINGS)
DERMABOND ADVANCED .7 DNX12 (GAUZE/BANDAGES/DRESSINGS) IMPLANT
DRAIN JACKSON PRT FLT 7MM (DRAIN) IMPLANT
DRAPE LG THREE QUARTER DISP (DRAPES) ×2 IMPLANT
DRSG OPSITE POSTOP 4X10 (GAUZE/BANDAGES/DRESSINGS) ×2 IMPLANT
DURAPREP 26ML APPLICATOR (WOUND CARE) ×2 IMPLANT
ELECT REM PT RETURN 9FT ADLT (ELECTROSURGICAL) ×2
ELECTRODE REM PT RTRN 9FT ADLT (ELECTROSURGICAL) ×1 IMPLANT
EVACUATOR SILICONE 100CC (DRAIN) IMPLANT
EXTRACTOR VACUUM KIWI (MISCELLANEOUS) IMPLANT
EXTRACTOR VACUUM M CUP 4 TUBE (SUCTIONS) IMPLANT
GAUZE SPONGE 4X4 12PLY STRL LF (GAUZE/BANDAGES/DRESSINGS) ×1 IMPLANT
GLOVE SURG SS PI 6.5 STRL IVOR (GLOVE) ×4 IMPLANT
GOWN STRL REIN XL XLG (GOWN DISPOSABLE) ×4 IMPLANT
KIT ABG SYR 3ML LUER SLIP (SYRINGE) IMPLANT
NDL HYPO 25X5/8 SAFETYGLIDE (NEEDLE) ×1 IMPLANT
NDL SPNL 22GX3.5 QUINCKE BK (NEEDLE) ×1 IMPLANT
NEEDLE HYPO 25X5/8 SAFETYGLIDE (NEEDLE) ×2 IMPLANT
NEEDLE SPNL 22GX3.5 QUINCKE BK (NEEDLE) ×2 IMPLANT
NS IRRIG 1000ML POUR BTL (IV SOLUTION) ×2 IMPLANT
PACK C SECTION WH (CUSTOM PROCEDURE TRAY) ×2 IMPLANT
PAD ABD 7.5X8 STRL (GAUZE/BANDAGES/DRESSINGS) ×1 IMPLANT
PAD OB MATERNITY 4.3X12.25 (PERSONAL CARE ITEMS) ×2 IMPLANT
RETRACTOR WND ALEXIS 25 LRG (MISCELLANEOUS) IMPLANT
RTRCTR WOUND ALEXIS 25CM LRG (MISCELLANEOUS) ×2
STRIP CLOSURE SKIN 1/4X4 (GAUZE/BANDAGES/DRESSINGS) IMPLANT
SUT CHROMIC 2 0 SH (SUTURE) ×2 IMPLANT
SUT MNCRL AB 3-0 PS2 27 (SUTURE) ×2 IMPLANT
SUT SILK 0 FSL (SUTURE) IMPLANT
SUT VIC AB 0 CT1 27 (SUTURE) ×8
SUT VIC AB 0 CT1 27XBRD ANBCTR (SUTURE) ×2 IMPLANT
SUT VIC AB 0 CT1 36 (SUTURE) IMPLANT
SUT VIC AB 0 CTXB 36 (SUTURE) IMPLANT
SUT VIC AB 2-0 CT1 27 (SUTURE) ×4
SUT VIC AB 2-0 CT1 TAPERPNT 27 (SUTURE) ×2 IMPLANT
SUT VIC AB 2-0 SH 27 (SUTURE)
SUT VIC AB 2-0 SH 27XBRD (SUTURE) IMPLANT
SYR CONTROL 10ML LL (SYRINGE) ×2 IMPLANT
TAPE CLOTH SURG 4X10 WHT LF (GAUZE/BANDAGES/DRESSINGS) ×1 IMPLANT
TOWEL OR 17X24 6PK STRL BLUE (TOWEL DISPOSABLE) ×2 IMPLANT
TRAY FOLEY CATH 14FR (SET/KITS/TRAYS/PACK) ×1 IMPLANT
WATER STERILE IRR 1000ML POUR (IV SOLUTION) ×1 IMPLANT

## 2013-02-03 NOTE — Progress Notes (Signed)
Jessica Mahoney is a 27 y.o. G2P0010 at [redacted]w[redacted]d by LMP admitted for active labor Pt seen at 1130am Subjective:  Patient is complaining of significant pain.  Epidural catheter is leaking. Anesthesia has been called.   Objective: BP 141/93  Pulse 103  Temp(Src) 98.3 F (36.8 C) (Oral)  Resp 20  Ht 5\' 7"  (1.702 m)  Wt 208 lb (94.348 kg)  BMI 32.57 kg/m2  LMP 05/08/2012 I/O last 3 completed shifts: In: 416.7 [I.V.:416.7] Out: 1300 [Urine:1300] Total I/O In: -  Out: 550 [Urine:550]  FHT:  FHR: 140 bpm, variability: minimal ,  accelerations:  Present,  decelerations:  Absent UC:   regular, every 3 minutes, but [place] units have been adequate for less than 1 hour since starting Pitocin SVE:   Dilation: 8 Effacement (%): 90 Station: +1;+2 Exam by:: Dr. Pennie Rushing  Labs: Lab Results  Component Value Date   WBC 10.8* 02/02/2013   HGB 10.7* 02/02/2013   HCT 32.2* 02/02/2013   MCV 83.2 02/02/2013   PLT 256 02/02/2013    Assessment / Plan: Protracted active phase  though adequate labor  has not been demonstrated   Preeclampsia:  no signs or symptoms of toxicity single elevated blood pressure Fetal Wellbeing:  Category II Pain Control:  Epidural working poorly currently I/D:  n/a Anticipated MOD:  NSVD Recommend continuing to increase Pitocin to get consistent adequate labor with [place] units greater than 180. Plan to recheck. Cervix. At 1:40 PM to assess change Watch blood pressure carefully Jessica Mahoney P 02/03/2013, 12:40 PM

## 2013-02-03 NOTE — Progress Notes (Signed)
Report given to J. Oxley, CNM re: less than 1 cm change in 2 hours with inadequate contraction quality and frequency. Deferred decision on starting pitocin at this time.  Victorino Dike is coming to unit in a few minutes to evaluate and manage care. Candise Che, RN

## 2013-02-03 NOTE — Transfer of Care (Signed)
Immediate Anesthesia Transfer of Care Note  Patient: Jessica Mahoney  Procedure(s) Performed: Procedure(s): CESAREAN SECTION (N/A)  Patient Location: PACU  Anesthesia Type:Epidural  Level of Consciousness: awake, alert  and oriented  Airway & Oxygen Therapy: Patient Spontanous Breathing  Post-op Assessment: Report given to PACU RN and Post -op Vital signs reviewed and stable  Post vital signs: Reviewed and stable  Complications: No apparent anesthesia complications

## 2013-02-03 NOTE — Anesthesia Postprocedure Evaluation (Signed)
  Anesthesia Post Note  Patient: Jessica Mahoney  Procedure(s) Performed: Procedure(s) (LRB): CESAREAN SECTION (N/A)  Anesthesia type: GA  Patient location: PACU  Post pain: Pain level controlled  Post assessment: Post-op Vital signs reviewed  Last Vitals:  Filed Vitals:   02/03/13 1745  BP: 136/85  Pulse: 97  Temp:   Resp: 24    Post vital signs: Reviewed  Level of consciousness: sedated  Complications: No apparent anesthesia complications

## 2013-02-03 NOTE — Progress Notes (Signed)
  Subjective: Pt sitting up in bed watching tv.  Discussed with pt the lack of cervical change.  R&B of IUPC discussed, pt voiced understanding and wished to proceed with placement.  Objective: BP 114/68  Pulse 120  Temp(Src) 98.5 F (36.9 C) (Axillary)  Resp 18  Ht 5\' 7"  (1.702 m)  Wt 208 lb (94.348 kg)  BMI 32.57 kg/m2  LMP 05/08/2012      FHT:  Cat II UC:   regular, every 2-6 minutes  SVE:   Dilation: 6 Effacement (%): 90 Station: 0 Exam by:: GPayne, RN  Assessment / Plan:  Labor: No cervical change in 4 hours, since AROM; IUPC placed Preeclampsia: no s/s  Fetal Wellbeing: Cat II  Pain Control: Epidural  I/D: GBS neg; AROM x 4 Anticipated MOD: SVD   Jessica Mahoney 02/03/2013, 3:05 AM

## 2013-02-03 NOTE — Progress Notes (Signed)
Cervix is 78 cm dilated, 70% effaced, and a -1-0 station. Nonstress test is category 1. The patient has not progressed her cervix over the past 8 hours in spite of Pitocin augmentation with periods of adequate contractions (some periods of inadequate contractions). The patient is beginning to have swelling of her cervix and I believe this is consistent with a pelvic dystocia. I reviewed the management options with the patient and her family. Risk and benefits were reviewed. Questions were answered. The patient has decided that she was to proceed with cesarean delivery. The specific risk of cesarean delivery were reviewed including, but not limited to, anesthetic complications, bleeding, infections, and possible damage to the surrounding organs.  Dr. Stefano Gaul

## 2013-02-03 NOTE — Op Note (Signed)
Cesarean Section Procedure Note  Indications: failure to progress: arrest of dilation  Pre-operative Diagnosis: 38 week 5 day pregnancy.  Post-operative Diagnosis: same  Surgeon: Hal Morales  First Assistant:  Surgeon: Hal Morales   Assistants: none  Anesthesia: Epidural anesthesia  ASA Class: 2  Procedure Details  The patient was seen in the Holding Room. The risks, benefits, complications, treatment options, and expected outcomes were discussed with the patient by Dr. Stefano Gaul who was on call at the time of the patient's last cervix check.  After conference with her family, the  patient concurred with the proposed plan, giving informed consent to Dr. Pennie Rushing who took over as on call attending.  The site of surgery properly noted/marked. The patient was taken to Operating Room # 9, identified as Jessica Mahoney and the procedure verified as C-Section Delivery. A Time Out was held and the above information confirmed.  After asttainment of adequate anesthesia, the patient was  prepped with Chloraprep in the usual sterile manner.A foley catheter was placed under sterile conditions after the perineum was prepped with Betadine.  The patient was then draped in the usual fashion.   Suprapubicsubcutaneous injection of 0.25% Bupivacaine   A Pfannenstiel incision was made and carried down through the subcutaneous tissue to the fascia. Fascial incision was made and extended transversely. The fascia was separated from the underlying rectus tissue superiorly and inferiorly. The peritoneum was identified and entered. Peritoneal incision was extended longitudinally. An Alexis retractor was placed.   A low transverse uterine incision was made two cm above the uterovesical fold, and that incision extended transversely bluntly.The infant was  delivered from OT presentation was a living female infant named [name] with Apgar scores of 8 at one minute and 9 at five minutes. After the umbilical cord was  clamped and cut cord blood was obtained for evaluation. The placenta was removed intact and appeared normal. The uterine outline, tubes and ovaries appeared norma for the gravid state. The uterine incision was closed with running locked sutures of 0 Vicryl. An imbricating layer of sutures was placed. Hemostasis was observed. Lavage was carried out until clear. The peritoneum was closed with a running suture of 2-0 Vicryl.  The rectus muscles were reapproximated with a figure of 8 suture of 2-0 Vicryl.  The fascia was then reapproximated with a running sutures of 0 Vicryl .Renforcing figure of 8 sutures of 0Vicryl were placed on either side of midline.   The skin was reapproximated with 3-0 moncryl.  A sterile dressing was applied.  Instrument, sponge, and needle counts were correct prior to the abdominal closure and at the conclusion of the case.   Findings:  Placenta contained a 3vessel cord    Estimated Blood Loss:  1000cc         Drains: none         Total IV Fluids:         Specimens: Placenta         Implants: none         Complications: ::"None; patient tolerated the procedure well."         Disposition: PACU - hemodynamically stable after vasotonic support of BP in or with plans for Neosynephrine drip by Anesthesia as fluid volume is replaced         Condition: stable  Attending Attestation: I performed the procedure.    [name] P  02/03/2013 5:01 PM

## 2013-02-03 NOTE — Progress Notes (Signed)
02/03/13 1450  Clinical Encounter Type  Visited With Patient and family together (boyfriend Terrance, mom, sister)  Visit Type Spiritual support;Social support  Referral From Nurse  Spiritual Encounters  Spiritual Needs Prayer   Referred by Milinda Cave, RN to offer prayer at pt's request as she prepared mentally and emotionally for unexpected c-section.  Jessica Mahoney and her family appreciated support, encouragement, and prayer for safety, joy, and blessing of baby/family/medical team.  Prayer helped to reduce anxiety, increase/complement sense of nurture and care, and to help family be aware of ongoing chaplain availability.  Please page as needed.  [address] [place], [place] [phone]

## 2013-02-04 LAB — CBC
HCT: 21 % — ABNORMAL LOW (ref 36.0–46.0)
Hemoglobin: 7 g/dL — ABNORMAL LOW (ref 12.0–15.0)
MCH: 27.8 pg (ref 26.0–34.0)
MCHC: 33.3 g/dL (ref 30.0–36.0)
MCV: 83.3 fL (ref 78.0–100.0)

## 2013-02-04 MED ORDER — FERROUS SULFATE 325 (65 FE) MG PO TABS
325.0000 mg | ORAL_TABLET | Freq: Two times a day (BID) | ORAL | Status: DC
  Administered 2013-02-04 – 2013-02-06 (×4): 325 mg via ORAL
  Filled 2013-02-04 (×4): qty 1

## 2013-02-04 NOTE — Progress Notes (Signed)
Patient ID: Jessica Mahoney, female   DOB: 01-Sep-1985, 27 y.o.   MRN: [id number]  Reviewed CNM note and agree.  In to assess pt.  Post op hemoglobin was 7.0 this am.  EBL ~1000 with C-section. No complaints.  Denies dizziness, SOB.  Ambulating without difficulty. AFVSS Gen:  NAD, comfortable. Abd:  Fundus firm, NT.  Honeycomb dressing 30% soiled, old  A/P:  S/p Cesarean section, Severe anemia. Doing well. No s/sxs of symptomatic anemia. Agree with iron supplementation. Continue routine postop care.

## 2013-02-04 NOTE — Anesthesia Postprocedure Evaluation (Signed)
  Anesthesia Post-op Note  Anesthesia Post Note  Patient: Jessica Mahoney  Procedure(s) Performed: Procedure(s) (LRB): CESAREAN SECTION (N/A)  Anesthesia type: Epidural  Patient location: Mother/Baby  Post pain: Pain level controlled  Post assessment: Post-op Vital signs reviewed  Last Vitals:  Filed Vitals:   02/04/13 0430  BP: 102/70  Pulse: 82  Temp: 36.6 C  Resp: 18    Post vital signs: Reviewed  Level of consciousness:alert  Complications: No apparent anesthesia complications

## 2013-02-04 NOTE — Progress Notes (Signed)
Subjective: Postpartum Day 1: Cesarean Delivery Patient reports feeling well.  Ambulating, voiding and tol po liquids and solids without difficulty.  Pos flatus.  Neg BM.  Working on breastfeeding.  Reports no pain at present.  Denies weakness or dizziness with ambulation.      Objective: Vital signs in last 24 hours: Temp:  [97.4 F (36.3 C)-101.2 F (38.4 C)] 98.1 F (36.7 C) (08/23 1206) Pulse Rate:  [74-113] 98 (08/23 1206) Resp:  [14-24] 20 (08/23 1206) BP: (102-136)/(65-90) 129/72 mmHg (08/23 1206) SpO2:  [97 %-100 %] 98 % (08/23 1206) Filed Vitals:   02/04/13 0840 02/04/13 0842 02/04/13 0844 02/04/13 1206  BP: 114/72 112/72 117/80 129/72  Pulse: 76 96 96 98  Temp: 98.2 F (36.8 C)   98.1 F (36.7 C)  TempSrc: Oral   Oral  Resp: 18   20  Height:      Weight:      SpO2: 100%   98%   Physical Exam:  General: alert, cooperative and no distress Heart:  RRR Lungs:  CTA bilat Abd:  Soft, NT with pos BS x 4 quads.   Lochia: appropriate, sm rubra Uterine Fundus: firm, 1 below umb. Incision: Occlusive dsg intact with sm am old serosanguinous drainage noted.  No redness noted.   DVT Evaluation: No evidence of DVT seen on physical exam. Negative Homan's sign bilat. No significant calf/ankle edema.   Recent Labs  02/02/13 2140 02/04/13 0632  HGB 10.7* 7.0*  HCT 32.2* 21.0*    Assessment/Plan: Status post Cesarean section. Doing well postoperatively.  Asymptomatic postoperative anemia-hemodynamically stable.  Continue current care. Begin iron supplementation   Berk Pilot O. 02/04/2013, 3:08 PM

## 2013-02-05 NOTE — Progress Notes (Signed)
Subjective: Postpartum Day 2: Cesarean Delivery due to FTP and arrest of dilation Patient up ad lib, reports no syncope or dizziness, HA, SOB, or tachycardia.  Up ambulating well.  + flatus.  Overall reports feeling well. Feeding:  Breastfeeding Contraceptive plan:  Unknown at this time  Objective: Vital signs in last 24 hours: Temp:  [97.2 F (36.2 C)-98.3 F (36.8 C)] 97.2 F (36.2 C) (08/24 0553) Pulse Rate:  [91-102] 91 (08/24 0553) Resp:  [16-20] 18 (08/24 0553) BP: (122-129)/(72-80) 123/80 mmHg (08/24 0553) SpO2:  [97 %-98 %] 97 % (08/23 1710)  Physical Exam:  General: alert, cooperative and no distress Lochia: appropriate Uterine Fundus: firm Incision: healing well DVT Evaluation: No evidence of DVT seen on physical exam. Negative Homan's sign. JP drain:   n/a   Recent Labs  02/02/13 2140 02/04/13 0632  HGB 10.7* 7.0*  HCT 32.2* 21.0*    Assessment/Plan: Status post Cesarean section day 2. Asymptomatic anemia Doing well postoperatively.  Continue current care Continue Fe supplementation Plan for discharge tomorrow    Haroldine Laws 02/05/2013, 10:22 AM

## 2013-02-06 ENCOUNTER — Encounter (HOSPITAL_COMMUNITY): Payer: Self-pay | Admitting: Obstetrics and Gynecology

## 2013-02-06 MED ORDER — OXYCODONE-ACETAMINOPHEN 5-325 MG PO TABS: 1.0000 | ORAL_TABLET | ORAL | Status: DC | PRN

## 2013-02-06 MED ORDER — IBUPROFEN 600 MG PO TABS: 600.0000 mg | ORAL_TABLET | Freq: Four times a day (QID) | ORAL | Status: DC

## 2013-02-06 MED ORDER — FERROUS SULFATE 325 (65 FE) MG PO TABS: 325.0000 mg | ORAL_TABLET | Freq: Two times a day (BID) | ORAL | Status: DC

## 2013-02-06 NOTE — Discharge Summary (Signed)
Cesarean Section Delivery Discharge Summary  Jessica Mahoney  DOB:    1985/12/03 MRN:    [id number] CSN:    [id number]  Date of admission:                  02/02/13  Date of discharge:                   02/06/13  Procedures this admission: C/S for FTP and arrest of dilation  Date of Delivery: 02/03/13 by Dr. Stefano Gaul  Newborn Data:  Live born female  Birth Weight: 8 lb 7.8 oz (3850 g) APGAR: ,   Home with mother. Name: "Jessica Mahoney" Circumcision Plan: Out patient  History of Present Illness:  Ms. Jessica Mahoney is a 27 y.o. female, G2P1011, who presents at [redacted]w[redacted]d weeks gestation. The patient has been followed at the [facility] division of [facility].    Her pregnancy has been complicated by:  Patient Active Problem List   Diagnosis Date Noted  . Cesarean delivery delivered 02/06/2013  . Candidiasis of skin 02/02/2013  . Anemia 02/02/2013  . Decreased fetal movement 02/02/2013  . Sciatica 02/02/2013  . Lower back pain 02/02/2013  . Constipation in pregnancy 08/10/2012  . Fibroid, uterine 08/10/2012    Hospital course:  The patient was admitted for labor.   Her postpartum course was complicated by severe asymptomatic anemia, declined blood transfusion. She was discharged to home on postpartum day 3 doing well.  Feeding:  Breast and bottle  Contraception:  oral contraceptives (estrogen/progesterone) at 6 weeks  Discharge hemoglobin:  Hemoglobin  Date Value Range Status  02/04/2013 7.0* 12.0 - 15.0 g/dL Final     REPEATED TO VERIFY     DELTA CHECK NOTED     HCT  Date Value Range Status  02/04/2013 21.0* 36.0 - 46.0 % Final    Discharge Physical Exam:   General: alert, cooperative and no distress Lochia: appropriate Uterine Fundus: firm Incision: healing well DVT Evaluation: No evidence of DVT seen on physical exam. Negative Homan's sign.  Intrapartum Procedures: cesarean: low cervical, transverse Postpartum  Procedures: none Complications-Operative and Postpartum: Anemia  Discharge Diagnoses: Term Pregnancy-delivered, anemia  Discharge Information:  Activity:           pelvic rest Diet:                routine Medications: PNV, Ibuprofen, Iron and Percocet Condition:      stable Instructions:  refer to practice specific booklet Discharge to: home  Follow-up Information   Follow up with [facility]. Schedule an appointment as soon as possible for a visit in 6 weeks. (Call with any questions or concerns.)    Specialty:  Obstetrics and Gynecology   Contact information:   [address] [place] [address] [phone]       Haroldine Laws 02/06/2013

## 2013-02-06 NOTE — Progress Notes (Signed)
Ur chart review completed.  

## 2013-02-08 ENCOUNTER — Encounter (HOSPITAL_COMMUNITY): Payer: Self-pay | Admitting: *Deleted

## 2013-02-10 ENCOUNTER — Encounter (HOSPITAL_COMMUNITY): Payer: Self-pay | Admitting: Obstetrics and Gynecology

## 2013-02-10 NOTE — Addendum Note (Signed)
Addendum created 02/10/13 2006 by Eusebio Friendly., MD   Modules edited: Anesthesia Events

## 2013-09-23 IMAGING — US US OB COMP LESS 14 WK
1 series · 13 of 28 positions shown · non-contrast
Comparison: None.

CLINICAL DATA: Pelvic pain.

OBSTETRIC <14 WK US AND TRANSVAGINAL OB US
TECHNIQUE: Both transabdominal and transvaginal ultrasound
examinations were performed for complete evaluation of the
gestation as well as the maternal uterus, adnexal regions, and
pelvic cul-de-sac.  Transvaginal technique was performed to assess
early pregnancy.

[Series 1: us ob comp less 14 wks · 13 of 53 slices shown]
[im 2/53]
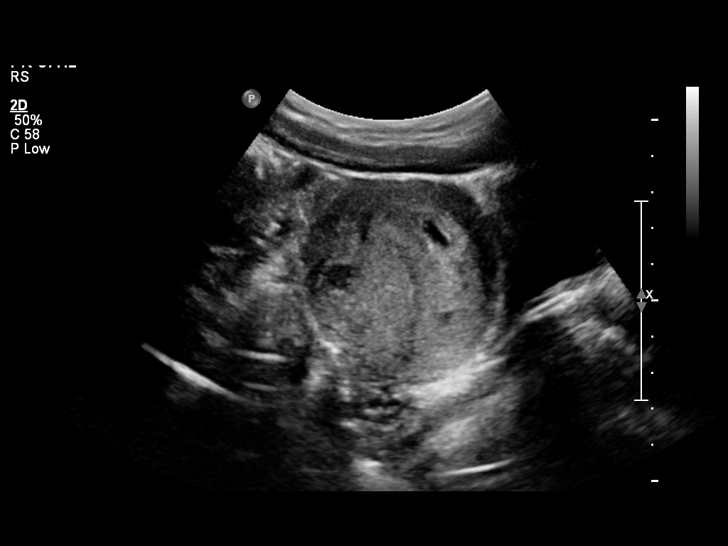
[im 6/53]
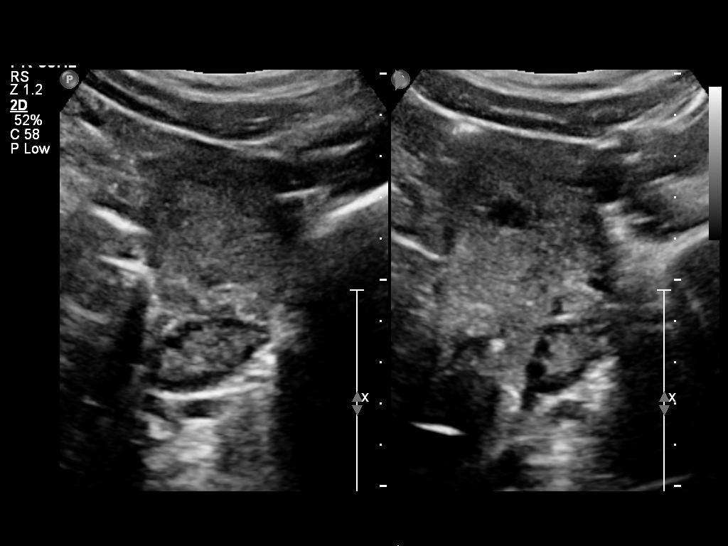
[im 10/53]
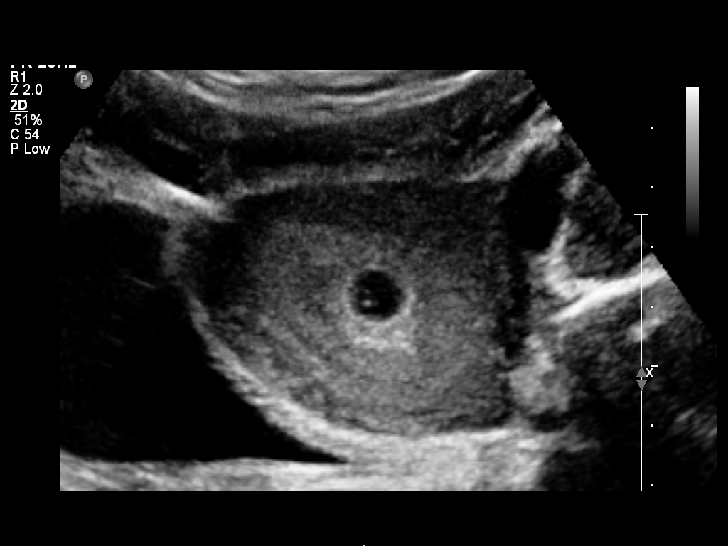
[im 14/53]
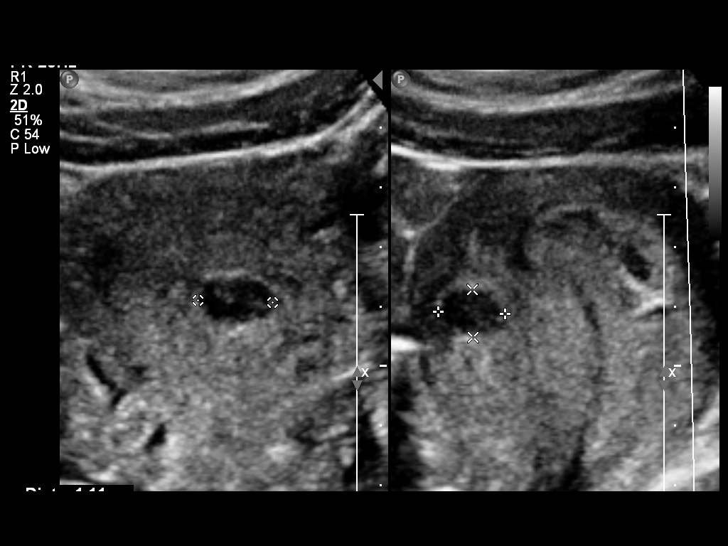
[im 18/53]
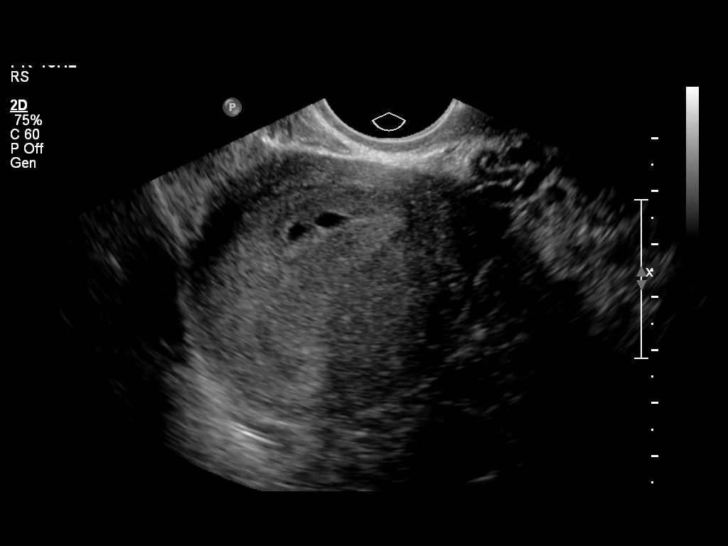
[im 22/53]
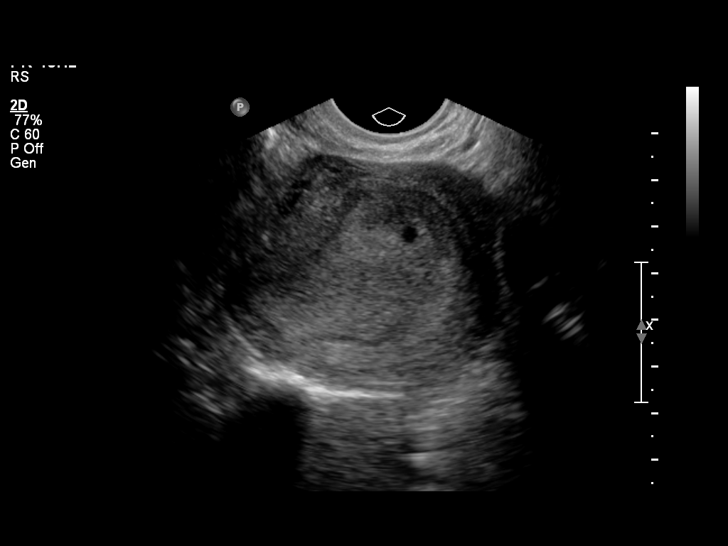
[im 27/53]
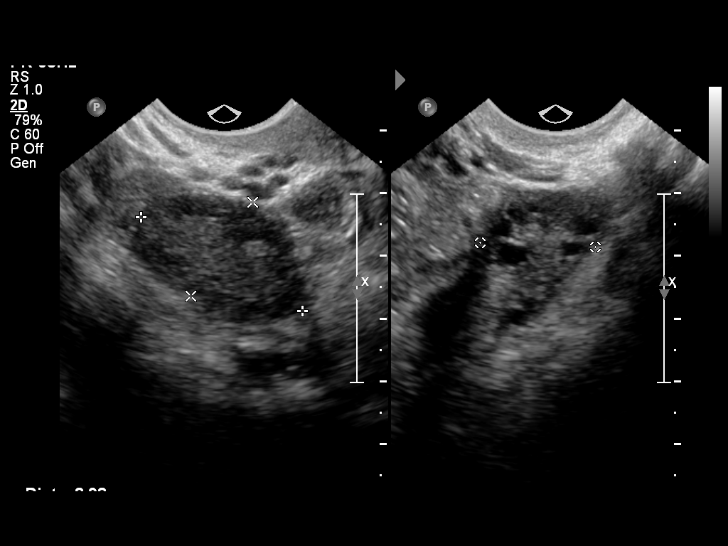
[im 31/53]
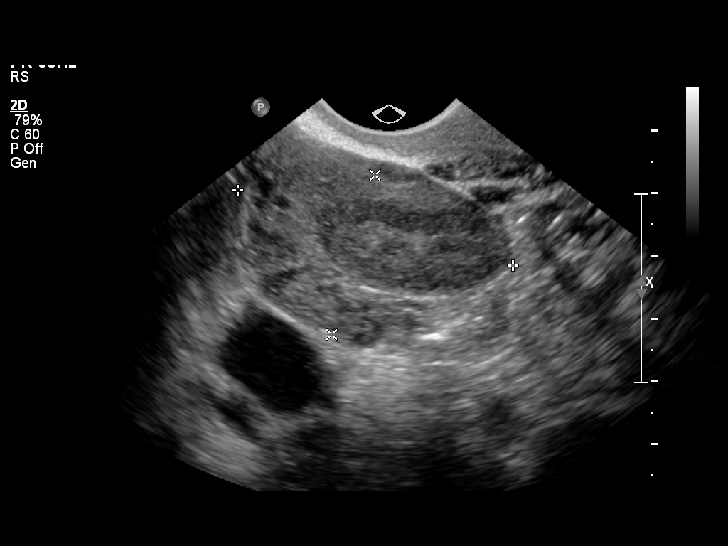
[im 35/53]
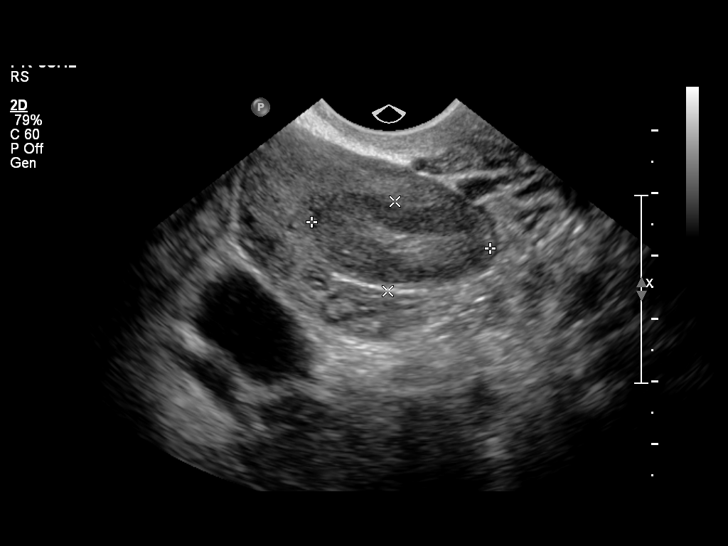
[im 39/53]
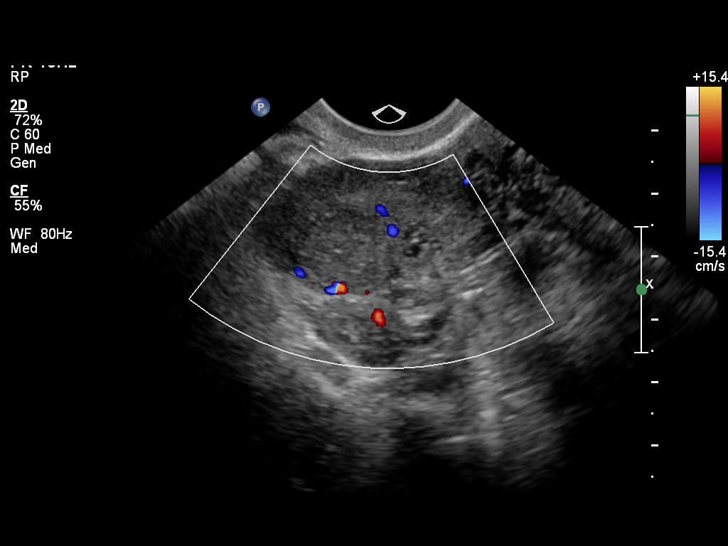
[im 43/53]
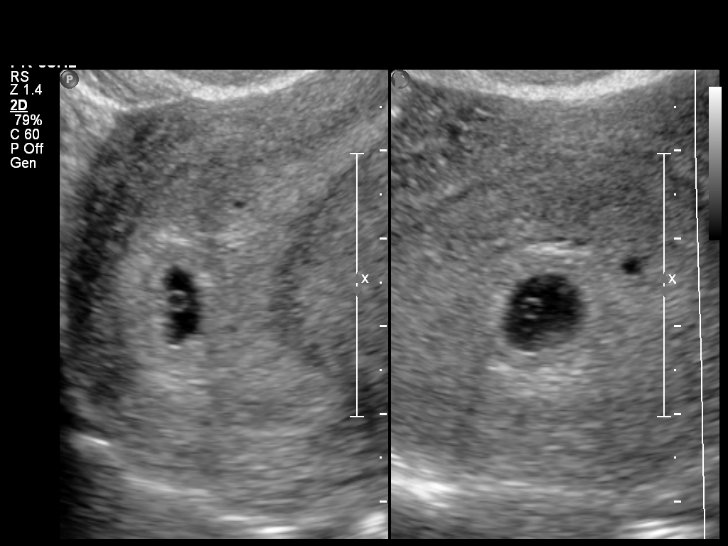
[im 47/53]
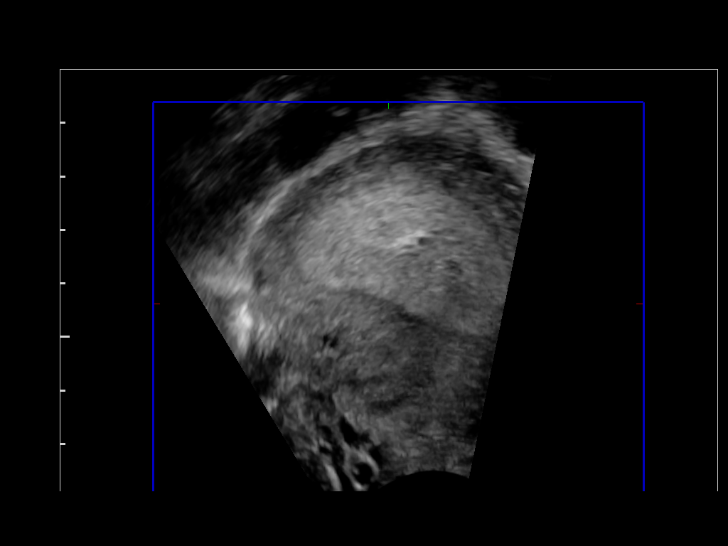
[im 51/53]
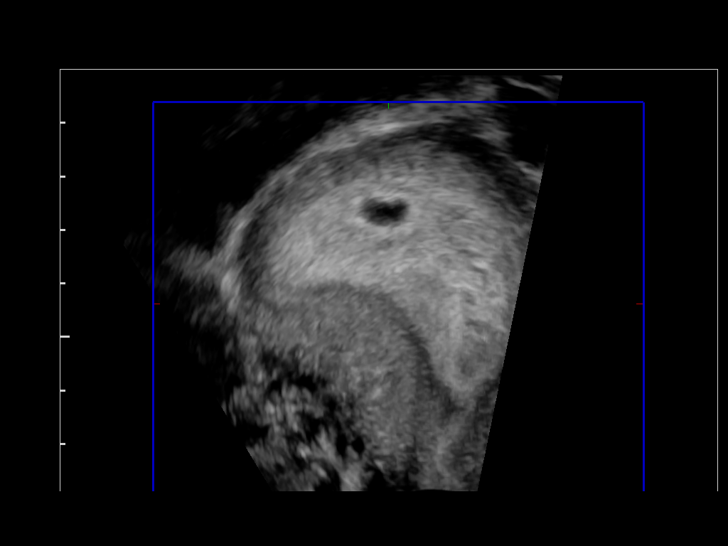

[13 of 28 positions shown; findings below may reference images not displayed]

Intrauterine gestational sac:  Visualized/normal in shape.
Yolk sac: Yes
Embryo: No
Cardiac Activity: N/A

MSD: 7.1 mm  5 w 2 d

Maternal uterus/adnexae:
No subchorionic hemorrhage is noted.  A few small endometrial cysts
are seen.  A 1.3 cm fibroid is incidentally noted within the
posterior wall of the uterus.  The uterus is otherwise unremarkable
in appearance.

The ovaries are within normal limits.  The right ovary measures
x 2.6 x 2.8 cm, while the left ovary measures 3.0 x 1.8 x 1.8 cm.
No suspicious adnexal masses are seen; there is no evidence for
ovarian torsion.  A mildly hypoechoic 2.9 cm focus at the right
ovary is thought to reflect the corpus luteum.

No free fluid is seen within the pelvic cul-de-sac.
IMPRESSION: 1.  Single intrauterine gestational sac noted, with a mean sac
diameter of 7 mm, corresponding to a gestational age of 5 weeks 2
days.  This matches the gestational age by LMP, and reflects an
estimated date of delivery February 12, 2013.
2.  Few small endometrial cysts seen; 1.3 cm fibroid incidentally
noted within the posterior wall of the uterus.

## 2013-12-11 ENCOUNTER — Encounter (INDEPENDENT_AMBULATORY_CARE_PROVIDER_SITE_OTHER): Payer: Self-pay

## 2013-12-11 ENCOUNTER — Encounter: Payer: Self-pay | Admitting: Neurology

## 2013-12-11 ENCOUNTER — Ambulatory Visit (INDEPENDENT_AMBULATORY_CARE_PROVIDER_SITE_OTHER): Payer: No Typology Code available for payment source | Admitting: Neurology

## 2013-12-11 VITALS — BP 104/66 | HR 86 | Temp 97.6°F | Ht 67.0 in | Wt 158.0 lb

## 2013-12-11 DIAGNOSIS — M5430 Sciatica, unspecified side: Secondary | ICD-10-CM

## 2013-12-11 DIAGNOSIS — M5431 Sciatica, right side: Secondary | ICD-10-CM
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
## 2013-12-11 NOTE — Patient Instructions (Signed)
You have right sided sciatica symptoms, let's do a MRI lumbar spine and you can go ahead and schedule physical therapy through your GYN. We will also do a nerve and muscle test in your lower extremities.

## 2013-12-13 ENCOUNTER — Other Ambulatory Visit: Payer: No Typology Code available for payment source

## 2014-01-01 ENCOUNTER — Telehealth: Payer: Self-pay | Admitting: *Deleted

## 2014-01-01 NOTE — Telephone Encounter (Signed)
We have never prescribed these medications for the patient.   OV says: She is advised to continue to use a heat pad as needed as it has helped some as well as an anti-inflammatory over-the-counter as needed. I called the patient back.  Got no answer.  Left message.

## 2014-01-05 ENCOUNTER — Encounter: Payer: No Typology Code available for payment source | Admitting: Neurology

## 2014-01-05 ENCOUNTER — Telehealth: Payer: Self-pay | Admitting: Neurology

## 2014-01-05 NOTE — Telephone Encounter (Signed)
This patient did not show for an EMG appointment today.

## 2014-02-11 ENCOUNTER — Emergency Department (HOSPITAL_COMMUNITY)
Admission: EM | Admit: 2014-02-11 | Discharge: 2014-02-11 | Disposition: A | Discharge status: Home / Self Care | Payer: No Typology Code available for payment source | Attending: Emergency Medicine | Admitting: Emergency Medicine

## 2014-02-11 ENCOUNTER — Encounter (HOSPITAL_COMMUNITY): Payer: Self-pay | Admitting: Emergency Medicine

## 2014-02-11 ENCOUNTER — Emergency Department (HOSPITAL_COMMUNITY): Payer: No Typology Code available for payment source

## 2014-02-11 DIAGNOSIS — Z791 Long term (current) use of non-steroidal anti-inflammatories (NSAID): Secondary | ICD-10-CM | POA: Diagnosis not present

## 2014-02-11 DIAGNOSIS — Z8619 Personal history of other infectious and parasitic diseases: Secondary | ICD-10-CM | POA: Insufficient documentation

## 2014-02-11 DIAGNOSIS — Z862 Personal history of diseases of the blood and blood-forming organs and certain disorders involving the immune mechanism: Secondary | ICD-10-CM | POA: Insufficient documentation

## 2014-02-11 DIAGNOSIS — Y9389 Activity, other specified: Secondary | ICD-10-CM | POA: Diagnosis not present

## 2014-02-11 DIAGNOSIS — M25511 Pain in right shoulder: Secondary | ICD-10-CM

## 2014-02-11 DIAGNOSIS — S46909A Unspecified injury of unspecified muscle, fascia and tendon at shoulder and upper arm level, unspecified arm, initial encounter: Secondary | ICD-10-CM | POA: Diagnosis not present

## 2014-02-11 DIAGNOSIS — Y929 Unspecified place or not applicable: Secondary | ICD-10-CM | POA: Insufficient documentation

## 2014-02-11 DIAGNOSIS — X500XXA Overexertion from strenuous movement or load, initial encounter: Secondary | ICD-10-CM | POA: Diagnosis not present

## 2014-02-11 DIAGNOSIS — S4980XA Other specified injuries of shoulder and upper arm, unspecified arm, initial encounter: Secondary | ICD-10-CM | POA: Diagnosis present

## 2014-02-11 DIAGNOSIS — Z87828 Personal history of other (healed) physical injury and trauma: Secondary | ICD-10-CM | POA: Insufficient documentation

## 2014-02-11 DIAGNOSIS — Z87891 Personal history of nicotine dependence: Secondary | ICD-10-CM | POA: Insufficient documentation

## 2014-02-11 MED ORDER — CYCLOBENZAPRINE HCL 10 MG PO TABS: 10.0000 mg | ORAL_TABLET | Freq: Two times a day (BID) | ORAL | Status: DC | PRN

## 2014-02-11 MED ORDER — MELOXICAM 15 MG PO TABS: 15.0000 mg | ORAL_TABLET | Freq: Every day | ORAL | Status: DC

## 2014-02-11 NOTE — Discharge Instructions (Signed)
Heat Therapy Heat therapy can help ease sore, stiff, injured, and tight muscles and joints. Heat relaxes your muscles, which may help ease your pain.  RISKS AND COMPLICATIONS If you have any of the following conditions, do not use heat therapy unless your health care provider has approved:  Poor circulation.  Healing wounds or scarred skin in the area being treated.  Diabetes, heart disease, or high blood pressure.  Not being able to feel (numbness) the area being treated.  Unusual swelling of the area being treated.  Active infections.  Blood clots.  Cancer.  Inability to communicate pain. This may include young children and people who have problems with their brain function (dementia).  Pregnancy. Heat therapy should only be used on old, pre-existing, or long-lasting (chronic) injuries. Do not use heat therapy on new injuries unless directed by your health care provider. HOW TO USE HEAT THERAPY There are several different kinds of heat therapy, including:  Moist heat pack.  Warm water bath.  Hot water bottle.  Electric heating pad.  Heated gel pack.  Heated wrap.  Electric heating pad. Use the heat therapy method suggested by your health care provider. Follow your health care provider's instructions on when and how to use heat therapy. GENERAL HEAT THERAPY RECOMMENDATIONS  Do not sleep while using heat therapy. Only use heat therapy while you are awake.  Your skin may turn pink while using heat therapy. Do not use heat therapy if your skin turns red.  Do not use heat therapy if you have new pain.  High heat or long exposure to heat can cause burns. Be careful when using heat therapy to avoid burning your skin.  Do not use heat therapy on areas of your skin that are already irritated, such as with a rash or sunburn. SEEK MEDICAL CARE IF:  You have blisters, redness, swelling, or numbness.  You have new pain.  Your pain is worse. MAKE SURE  YOU:  Understand these instructions.  Will watch your condition.  Will get help right away if you are not doing well or get worse. Document Released: 08/24/2011 Document Revised: 10/16/2013 Document Reviewed: 07/25/2013 [facility] Patient Information 2015 [place], [place]. This information is not intended to replace advice given to you by your health care provider. Make sure you discuss any questions you have with your health care provider. Trigger Point Injection Trigger points are areas where you have muscle pain. A trigger point might feel like a knot in your muscle. It hurts to press on a trigger point. Sometimes the pain spreads out (radiates) to other parts of the body. For example, pressing on a trigger point in your shoulder might cause pain in your arm or neck. You might have one trigger point. Or, you might have more than one. People often have trigger points in their upper back and lower back. They also occur often in the neck and shoulders. Pain from a trigger point lasts for a long time. It can make it hard to keep moving. You might not be able to do the exercise or physical therapy that could help you deal with the pain. A trigger point injection may help. It does not work for everyone. But, it may relieve your pain for a few days or a few months. A trigger point injection does not cure long-lasting (chronic) pain. LET YOUR CAREGIVER KNOW ABOUT:  Any allergies (especially to latex, lidocaine, or steroids).  Blood-thinning medicines that you take. These drugs can lead to bleeding or bruising  after an injection. They include:  Aspirin.  Ibuprofen.  Clopidogrel.  Warfarin.  Other medicines you take. This includes all vitamins, herbs, eyedrops, over-the-counter medicines, and creams.  Use of steroids.  Recent infections.  Past problems with numbing medicines.  Bleeding problems.  Surgeries you have had.  Other health problems. RISKS AND COMPLICATIONS A trigger point  injection is a safe treatment. However, problems may develop, such as:  Minor side effects usually go away in 1 to 2 days. These may include:  Soreness.  Bruising.  Stiffness.  More serious problems are rare. But, they may include:  Bleeding under the skin (hematoma).  Skin infection.  Breaking off of the needle under your skin.  Lung puncture.  The trigger point injection may not work for you. BEFORE THE PROCEDURE You may need to stop taking any medicine that thins your blood. This is to prevent bleeding and bruising. Usually these medicines are stopped several days before the injection. No other preparation is needed. PROCEDURE  A trigger point injection can be given in your caregiver's office or in a clinic. Each injection takes 2 minutes or less.  Your caregiver will feel for trigger points. The caregiver may use a marker to circle the area for the injection.  The skin over the trigger point will be washed with a germ-killing (antiseptic) solution.  The caregiver pinches the spot for the injection.  Then, a very thin needle is used for the shot. You may feel pain or a twitching feeling when the needle enters the trigger point.  A numbing solution may be injected into the trigger point. Sometimes a drug to keep down swelling, redness, and warmth (inflammation) is also injected.  Your caregiver moves the needle around the trigger zone until the tightness and twitching goes away.  After the injection, your caregiver may put gentle pressure over the injection site.  Then it is covered with a bandage. AFTER THE PROCEDURE  You can go right home after the injection.  The bandage can be taken off after a few hours.  You may feel sore and stiff for 1 to 2 days.  Go back to your regular activities slowly. Your caregiver may ask you to stretch your muscles. Do not do anything that takes extra energy for a few days.  Follow your caregiver's instructions to manage and treat  other pain. Document Released: 05/21/2011 Document Revised: 09/26/2012 Document Reviewed: 05/21/2011 [facility] Patient Information 2015 [place], [place]. This information is not intended to replace advice given to you by your health care provider. Make sure you discuss any questions you have with your health care provider.  [facility] Cheri Guppy [address] [place], [place] [address] [phone]  [facility] Kara Dies [address], [place],  [address] [phone]
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
## 2014-02-11 NOTE — ED Notes (Signed)
Pt states usually every morning she has to pop right shoulder back into place. States the past 2 days shoulder has been hurting. No deformity noted to shoulder.

## 2014-02-25 NOTE — ED Provider Notes (Signed)
Medical screening examination/treatment/procedure(s) were performed by non-physician practitioner and as supervising physician I was immediately available for consultation/collaboration.   EKG Interpretation None       Leota Jacobsen, MD 02/25/14 1535

## 2014-02-25 NOTE — ED Provider Notes (Signed)
Medical screening examination/treatment/procedure(s) were performed by non-physician practitioner and as supervising physician I was immediately available for consultation/collaboration.   Leota Jacobsen, MD 02/25/14 1539

## 2014-04-16 ENCOUNTER — Encounter (HOSPITAL_COMMUNITY): Payer: Self-pay | Admitting: Emergency Medicine

## 2014-05-17 ENCOUNTER — Ambulatory Visit: Payer: No Typology Code available for payment source | Admitting: Neurology

## 2014-06-01 ENCOUNTER — Encounter (HOSPITAL_COMMUNITY): Payer: Self-pay | Admitting: *Deleted

## 2014-06-01 ENCOUNTER — Emergency Department (HOSPITAL_COMMUNITY)
Admission: EM | Admit: 2014-06-01 | Discharge: 2014-06-01 | Disposition: A | Discharge status: Home / Self Care | Payer: No Typology Code available for payment source | Attending: Emergency Medicine | Admitting: Emergency Medicine

## 2014-06-01 DIAGNOSIS — Z87828 Personal history of other (healed) physical injury and trauma: Secondary | ICD-10-CM | POA: Insufficient documentation

## 2014-06-01 DIAGNOSIS — Z8619 Personal history of other infectious and parasitic diseases: Secondary | ICD-10-CM | POA: Insufficient documentation

## 2014-06-01 DIAGNOSIS — R112 Nausea with vomiting, unspecified: Secondary | ICD-10-CM

## 2014-06-01 DIAGNOSIS — R109 Unspecified abdominal pain: Secondary | ICD-10-CM | POA: Insufficient documentation

## 2014-06-01 DIAGNOSIS — Z87891 Personal history of nicotine dependence: Secondary | ICD-10-CM | POA: Insufficient documentation

## 2014-06-01 DIAGNOSIS — Z3202 Encounter for pregnancy test, result negative: Secondary | ICD-10-CM | POA: Insufficient documentation

## 2014-06-01 DIAGNOSIS — Z87440 Personal history of urinary (tract) infections: Secondary | ICD-10-CM | POA: Insufficient documentation

## 2014-06-01 DIAGNOSIS — Z791 Long term (current) use of non-steroidal anti-inflammatories (NSAID): Secondary | ICD-10-CM | POA: Insufficient documentation

## 2014-06-01 DIAGNOSIS — Z79899 Other long term (current) drug therapy: Secondary | ICD-10-CM | POA: Insufficient documentation

## 2014-06-01 DIAGNOSIS — Z862 Personal history of diseases of the blood and blood-forming organs and certain disorders involving the immune mechanism: Secondary | ICD-10-CM | POA: Insufficient documentation

## 2014-06-01 DIAGNOSIS — R197 Diarrhea, unspecified: Secondary | ICD-10-CM

## 2014-06-01 LAB — URINALYSIS, ROUTINE W REFLEX MICROSCOPIC
Bilirubin Urine: NEGATIVE
Glucose, UA: NEGATIVE mg/dL
HGB URINE DIPSTICK: NEGATIVE
Ketones, ur: NEGATIVE mg/dL
Nitrite: NEGATIVE
PH: 7.5 (ref 5.0–8.0)
Protein, ur: NEGATIVE mg/dL
Specific Gravity, Urine: 1.031 — ABNORMAL HIGH (ref 1.005–1.030)
UROBILINOGEN UA: 1 mg/dL (ref 0.0–1.0)

## 2014-06-01 LAB — URINE MICROSCOPIC-ADD ON

## 2014-06-01 LAB — COMPREHENSIVE METABOLIC PANEL
ALBUMIN: 3.8 g/dL (ref 3.5–5.2)
ALK PHOS: 72 U/L (ref 39–117)
ALT: 9 U/L (ref 0–35)
ANION GAP: 11 (ref 5–15)
AST: 15 U/L (ref 0–37)
BUN: 11 mg/dL (ref 6–23)
CO2: 26 mEq/L (ref 19–32)
Calcium: 9 mg/dL (ref 8.4–10.5)
Chloride: 101 mEq/L (ref 96–112)
Creatinine, Ser: 0.77 mg/dL (ref 0.50–1.10)
GFR calc Af Amer: >90 mL/min (ref >90)
GFR calc non Af Amer: >90 mL/min (ref >90)
Glucose, Bld: 86 mg/dL (ref 70–99)
POTASSIUM: 4.1 meq/L (ref 3.7–5.3)
SODIUM: 138 meq/L (ref 137–147)
Total Bilirubin: 0.9 mg/dL (ref 0.3–1.2)
Total Protein: 8 g/dL (ref 6.0–8.3)

## 2014-06-01 LAB — CBC WITH DIFFERENTIAL/PLATELET
BASOS PCT: 0 % (ref 0–1)
Basophils Absolute: 0 10*3/uL (ref 0.0–0.1)
Eosinophils Absolute: 0.1 10*3/uL (ref 0.0–0.7)
Eosinophils Relative: 2 % (ref 0–5)
HCT: 37.6 % (ref 36.0–46.0)
Hemoglobin: 12.4 g/dL (ref 12.0–15.0)
Lymphocytes Relative: 17 % (ref 12–46)
Lymphs Abs: 1 10*3/uL (ref 0.7–4.0)
MCH: 29.2 pg (ref 26.0–34.0)
MCHC: 33 g/dL (ref 30.0–36.0)
MCV: 88.7 fL (ref 78.0–100.0)
Monocytes Absolute: 0.4 10*3/uL (ref 0.1–1.0)
Monocytes Relative: 7 % (ref 3–12)
NEUTROS ABS: 4.2 10*3/uL (ref 1.7–7.7)
NEUTROS PCT: 74 % (ref 43–77)
PLATELETS: 325 10*3/uL (ref 150–400)
RBC: 4.24 MIL/uL (ref 3.87–5.11)
RDW: 13.1 % (ref 11.5–15.5)
WBC: 5.8 10*3/uL (ref 4.0–10.5)

## 2014-06-01 LAB — LIPASE, BLOOD: Lipase: 30 U/L (ref 11–59)

## 2014-06-01 LAB — POC URINE PREG, ED: PREG TEST UR: NEGATIVE

## 2014-06-01 MED ORDER — ONDANSETRON HCL 4 MG PO TABS: 4.0000 mg | ORAL_TABLET | Freq: Four times a day (QID) | ORAL | Status: DC

## 2014-06-01 MED ORDER — ONDANSETRON 4 MG PO TBDP
8.0000 mg | ORAL_TABLET | Freq: Once | ORAL | Status: AC
  Administered 2014-06-01: 8 mg via ORAL
  Filled 2014-06-01: qty 2

## 2014-06-01 NOTE — ED Notes (Addendum)
Pt believes her food at the restaurant last night was not cooked fully. Pt friends at the restaurant who ate the same dish are also sick. abd tenderness all over abdomen.
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
## 2014-06-01 NOTE — ED Notes (Signed)
Pt reports onset last night of n/v/d and headache.

## 2014-06-01 NOTE — Discharge Instructions (Signed)

## 2015-02-20 ENCOUNTER — Encounter (HOSPITAL_COMMUNITY): Payer: Self-pay | Admitting: *Deleted

## 2015-02-20 ENCOUNTER — Emergency Department (HOSPITAL_COMMUNITY)
Admission: EM | Admit: 2015-02-20 | Discharge: 2015-02-20 | Disposition: A | Discharge status: Home / Self Care | Payer: No Typology Code available for payment source | Attending: Emergency Medicine | Admitting: Emergency Medicine

## 2015-02-20 DIAGNOSIS — M79604 Pain in right leg: Secondary | ICD-10-CM | POA: Insufficient documentation

## 2015-02-20 DIAGNOSIS — M545 Low back pain, unspecified: Secondary | ICD-10-CM

## 2015-02-20 DIAGNOSIS — Z862 Personal history of diseases of the blood and blood-forming organs and certain disorders involving the immune mechanism: Secondary | ICD-10-CM | POA: Insufficient documentation

## 2015-02-20 DIAGNOSIS — Z87828 Personal history of other (healed) physical injury and trauma: Secondary | ICD-10-CM | POA: Insufficient documentation

## 2015-02-20 DIAGNOSIS — Z8619 Personal history of other infectious and parasitic diseases: Secondary | ICD-10-CM | POA: Insufficient documentation

## 2015-02-20 DIAGNOSIS — M79605 Pain in left leg: Secondary | ICD-10-CM | POA: Insufficient documentation

## 2015-02-20 DIAGNOSIS — Z87440 Personal history of urinary (tract) infections: Secondary | ICD-10-CM | POA: Insufficient documentation

## 2015-02-20 DIAGNOSIS — R202 Paresthesia of skin: Secondary | ICD-10-CM | POA: Insufficient documentation

## 2015-02-20 DIAGNOSIS — Z87891 Personal history of nicotine dependence: Secondary | ICD-10-CM | POA: Insufficient documentation

## 2015-02-20 MED ORDER — SODIUM CHLORIDE 0.9 % IV BOLUS (SEPSIS)
1000.0000 mL | Freq: Once | INTRAVENOUS | Status: DC
Start: 2015-02-20 — End: 2015-02-20

## 2015-02-20 MED ORDER — NAPROXEN 375 MG PO TABS: 375.0000 mg | ORAL_TABLET | Freq: Two times a day (BID) | ORAL | Status: DC

## 2015-02-20 MED ORDER — CYCLOBENZAPRINE HCL 10 MG PO TABS
10.0000 mg | ORAL_TABLET | Freq: Two times a day (BID) | ORAL | Status: DC | PRN
Start: 2015-02-20 — End: 2016-11-13

## 2015-02-20 MED ORDER — TRAMADOL HCL 50 MG PO TABS: 50.0000 mg | ORAL_TABLET | Freq: Four times a day (QID) | ORAL | Status: DC | PRN

## 2015-02-20 NOTE — ED Notes (Signed)
Pt history of sciatica two years ago; pt c/o continued sciatica pain; worse x 1 wk; increased pain when walking; pain in lower back-bilateral buttocks

## 2015-02-20 NOTE — ED Notes (Signed)
Bed: WTR8 Expected date:  Expected time:  Means of arrival:  Comments: 

## 2015-02-20 NOTE — Discharge Instructions (Signed)
Back Pain, Adult °Low back pain is very common. About 1 in 5 people have back pain. The cause of low back pain is rarely dangerous. The pain often gets better over time. About half of people with a sudden onset of back pain feel better in just 2 weeks. About 8 in 10 people feel better by 6 weeks.  °CAUSES °Some common causes of back pain include: °· Strain of the muscles or ligaments supporting the spine. °· Wear and tear (degeneration) of the spinal discs. °· Arthritis. °· Direct injury to the back. °DIAGNOSIS °Most of the time, the direct cause of low back pain is not known. However, back pain can be treated effectively even when the exact cause of the pain is unknown. Answering your caregiver's questions about your overall health and symptoms is one of the most accurate ways to make sure the cause of your pain is not dangerous. If your caregiver needs more information, he or she may order lab work or imaging tests (X-rays or MRIs). However, even if imaging tests show changes in your back, this usually does not require surgery. °HOME CARE INSTRUCTIONS °For many people, back pain returns. Since low back pain is rarely dangerous, it is often a condition that people can learn to manage on their own.  °· Remain active. It is stressful on the back to sit or stand in one place. Do not sit, drive, or stand in one place for more than 30 minutes at a time. Take short walks on level surfaces as soon as pain allows. Try to increase the length of time you walk each day. °· Do not stay in bed. Resting more than 1 or 2 days can delay your recovery. °· Do not avoid exercise or work. Your body is made to move. It is not dangerous to be active, even though your back may hurt. Your back will likely heal faster if you return to being active before your pain is gone. °· Pay attention to your body when you  bend and lift. Many people have less discomfort when lifting if they bend their knees, keep the load close to their bodies, and  avoid twisting. Often, the most comfortable positions are those that put less stress on your recovering back. °· Find a comfortable position to sleep. Use a firm mattress and lie on your side with your knees slightly bent. If you lie on your back, put a pillow under your knees. °· Only take over-the-counter or prescription medicines as directed by your caregiver. Over-the-counter medicines to reduce pain and inflammation are often the most helpful. Your caregiver may prescribe muscle relaxant drugs. These medicines help dull your pain so you can more quickly return to your normal activities and healthy exercise. °· Put ice on the injured area. °· Put ice in a plastic bag. °· Place a towel between your skin and the bag. °· Leave the ice on for 15-20 minutes, 03-04 times a day for the first 2 to 3 days. After that, ice and heat may be alternated to reduce pain and spasms. °· Ask your caregiver about trying back exercises and gentle massage. This may be of some benefit. °· Avoid feeling anxious or stressed. Stress increases muscle tension and can worsen back pain. It is important to recognize when you are anxious or stressed and learn ways to manage it. Exercise is a great option. °SEEK MEDICAL CARE IF: °· You have pain that is not relieved with rest or medicine. °· You have pain that does not improve in 1 week. °· You have new symptoms. °· You are generally not feeling well. °SEEK   IMMEDIATE MEDICAL CARE IF:  °· You have pain that radiates from your back into your legs. °· You develop new bowel or bladder control problems. °· You have unusual weakness or numbness in your arms or legs. °· You develop nausea or vomiting. °· You develop abdominal pain. °· You feel faint. °Document Released: 06/01/2005 Document Revised: 12/01/2011 Document Reviewed: 10/03/2013 °[employer]® Patient Information ©2015 [employer]. This information is not intended to replace advice given to you by your health care provider. Make sure you  discuss any questions you have with your health care provider. ° °Back Injury Prevention °Back injuries can be extremely painful and difficult to heal. After having one back injury, you are much more likely to experience another later on. It is important to learn how to avoid injuring or re-injuring your back. The following tips can help you to prevent a back injury. °PHYSICAL FITNESS °· Exercise regularly and try to develop good tone in your abdominal muscles. Your abdominal muscles provide a lot of the support needed by your back. °· Do aerobic exercises (walking, jogging, biking, swimming) regularly. °· Do exercises that increase balance and strength (tai chi, yoga) regularly. This can decrease your risk of falling and injuring your back. °· Stretch before and after exercising. °· Maintain a healthy weight. The more you weigh, the more stress is placed on your back. For every pound of weight, 10 times that amount of pressure is placed on the back. °DIET °· Talk to your caregiver about how much calcium and vitamin D you need per day. These nutrients help to prevent weakening of the bones (osteoporosis). Osteoporosis can cause broken (fractured) bones that lead to back pain. °· Include good sources of calcium in your diet, such as dairy products, green, leafy vegetables, and products with calcium added (fortified). °· Include good sources of vitamin D in your diet, such as milk and foods that are fortified with vitamin D. °· Consider taking a nutritional supplement or a multivitamin if needed. °· Stop smoking if you smoke. °POSTURE °· Sit and stand up straight. Avoid leaning forward when you sit or hunching over when you stand. °· Choose chairs with good low back (lumbar) support. °· If you work at a desk, sit close to your work so you do not need to lean over. Keep your chin tucked in. Keep your neck drawn back and elbows bent at a right angle. Your arms should look like the letter "L." °· Sit high and close to  the steering wheel when you drive. Add a lumbar support to your car seat if needed. °· Avoid sitting or standing in one position for too long. Take breaks to get up, stretch, and walk around at least once every hour. Take breaks if you are driving for long periods of time. °· Sleep on your side with your knees slightly bent, or sleep on your back with a pillow under your knees. Do not sleep on your stomach. °LIFTING, TWISTING, AND REACHING °· Avoid heavy lifting, especially repetitive lifting. If you must do heavy lifting: °¨ Stretch before lifting. °¨ Work slowly. °¨ Rest between lifts. °¨ Use carts and dollies to move objects when possible. °¨ Make several small trips instead of carrying 1 heavy load. °¨ Ask for help when you need it. °¨ Ask for help when moving big, awkward objects. °· Follow these steps when lifting: °¨ Stand with your feet shoulder-width apart. °¨ Get as close to the object as you can. Do not try to   carts and dollies to move objects when possible.  ¨ Make several small trips instead of carrying 1 heavy load.  ¨ Ask for help when you need it.  ¨ Ask for help when moving big, awkward objects.  · Follow these steps when lifting:  ¨ Stand with your feet shoulder-width apart.  ¨ Get as close to the object as you can. Do not try to pick up heavy objects that are far from your body.  ¨ Use handles or lifting straps if they are available.  ¨ Bend at your knees. Squat down, but keep your heels off the floor.  ¨ Keep your shoulders pulled back, your chin tucked in, and your back straight.  ¨ Lift the object slowly, tightening the muscles in your legs, abdomen, and buttocks. Keep the object as close to the center of your body as possible.  ¨ When you put a load down, use these same guidelines in reverse.  · Do not:  ¨ Lift the object above your waist.  ¨ Twist at the waist while lifting or carrying a load. Move your feet if you need to turn, not your waist.  ¨ Bend over without bending at your knees.  · Avoid reaching over your head, across a table, or for an object on a high surface.  OTHER TIPS  · Avoid wet floors and keep sidewalks clear of ice to prevent falls.  · Do not sleep on a mattress that is too soft or too hard.  · Keep items that are used frequently within easy reach.  · Put heavier objects on shelves at waist level and  lighter objects on lower or higher shelves.  · Find ways to decrease your stress, such as exercise, massage, or relaxation techniques. Stress can build up in your muscles. Tense muscles are more vulnerable to injury.  · Seek treatment for depression or anxiety if needed. These conditions can increase your risk of developing back pain.  SEEK MEDICAL CARE IF:  · You injure your back.  · You have questions about diet, exercise, or other ways to prevent back injuries.  MAKE SURE YOU:  · Understand these instructions.  · Will watch your condition.  · Will get help right away if you are not doing well or get worse.  Document Released: 07/09/2004 Document Revised: 08/24/2011 Document Reviewed: 07/13/2011  [employer]® Patient Information ©2015 [employer]. This information is not intended to replace advice given to you by your health care provider. Make sure you discuss any questions you have with your health care provider.  Back Exercises  Back exercises help treat and prevent back injuries. The goal of back exercises is to increase the strength of your abdominal and back muscles and the flexibility of your back. These exercises should be started when you no longer have back pain. Back exercises include:  · Pelvic Tilt. Lie on your back with your knees bent. Tilt your pelvis until the lower part of your back is against the floor. Hold this position 5 to 10 sec and repeat 5 to 10 times.  · Knee to Chest. Pull first 1 knee up against your chest and hold for 20 to 30 seconds, repeat this with the other knee, and then both knees. This may be done with the other leg straight or bent, whichever feels better.  · Sit-Ups or Curl-Ups. Bend your knees 90 degrees. Start with tilting your pelvis, and do a partial, slow sit-up, lifting your trunk only 30 to 45 degrees off   hands, causing an arch in your low back. Repeat 3 to 5 times. Any initial stiffness and discomfort should lessen with repetition over time.  Shoulder-Lifts. Lie face down with arms beside your body. Keep hips and torso pressed to floor as you slowly lift your head and shoulders off the floor. Do not overdo your exercises, especially in the beginning. Exercises may cause you some mild back discomfort which lasts for a few minutes; however, if the pain is more severe, or lasts for more than 15 minutes, do not continue exercises until you see your caregiver. Improvement with exercise therapy for back problems is slow.  See your caregivers for assistance with developing a proper back exercise program. Document Released: 07/09/2004 Document Revised: 08/24/2011 Document Reviewed: 04/02/2011 [facility] Patient Information 2015 [place], [place]. This information is not intended to replace advice given to you by your health care provider. Make sure you discuss any questions you have with your health care provider. Piriformis Syndrome with Rehab Piriformis syndrome is a condition the affects the nervous system in the area of the hip, and is characterized by pain and possibly a loss of feeling in the backside (posterior) thigh that may extend down the entire length of the leg. The symptoms are caused by an increase in pressure on the sciatic nerve by the piriformis muscle, which is on the back of the hip and is responsible for externally rotating the hip. The sciatic nerve and its branches connect to much of the leg. Normally the sciatic nerve runs between  the piriformis muscle and other muscles. However, in certain individuals the nerve runs through the muscle, which causes an increase in pressure on the nerve and results in the symptoms of piriformis syndrome. SYMPTOMS   Pain, tingling, numbness, or burning in the back of the thigh that may also extend down the entire leg.  Occasionally, tenderness in the buttock.  Loss of function of the leg.  Pain that worsens when using the piriformis muscle (running, jumping, or stairs).  Pain that increases with prolonged sitting.  Pain that is lessened by lying flat on the back. CAUSES   Piriformis syndrome is the result of an increase in pressure placed on the sciatic nerve. Oftentimes, piriformis syndrome is an overuse injury.  Stress placed on the nerve from a sudden increase in the intensity, frequency, or duration of training.  Compensation of other extremity injuries. RISK INCREASES WITH:  Sports that involve the piriformis muscle (running, walking, or jumping).  You are born with (congenital) a defect in which the sciatic nerve passes through the muscle. PREVENTION  Warm up and stretch properly before activity.  Allow for adequate recovery between workouts.  Maintain physical fitness:  Strength, flexibility, and endurance.  Cardiovascular fitness. PROGNOSIS  If treated properly, the symptoms of piriformis syndrome usually resolve in 2 to 6 weeks. RELATED COMPLICATIONS   Persistent and possibly permanent pain and numbness in the lower extremity.  Weakness of the extremity that may progress to disability and inability to compete. TREATMENT  The most effective treatment for piriformis syndrome is rest from any activities that aggravate the symptoms. Ice and pain medication may help reduce pain and inflammation. The use of strengthening and stretching exercises may help reduce pain with activity. These exercises may be performed at home or with a therapist. A referral to a  therapist may be given for further evaluation and treatment, such as ultrasound. Corticosteroid injections may be given to reduce inflammation that is causing pressure to be placed on  the sciatic nerve. If nonsurgical (conservative) treatment is unsuccessful, then surgery may be recommended.  MEDICATION   If pain medication is necessary, then nonsteroidal anti-inflammatory medications, such as aspirin and ibuprofen, or other minor pain relievers, such as acetaminophen, are often recommended.  Do not take pain medication for 7 days before surgery.  Prescription pain relievers may be given if deemed necessary by your caregiver. Use only as directed and only as much as you need.  Corticosteroid injections may be given by your caregiver. These injections should be reserved for the most serious cases, because they may only be given a certain number of times. HEAT AND COLD:   Cold treatment (icing) relieves pain and reduces inflammation. Cold treatment should be applied for 10 to 15 minutes every 2 to 3 hours for inflammation and pain and immediately after any activity that aggravates your symptoms. Use ice packs or massage the area with a piece of ice (ice massage).  Heat treatment may be used prior to performing the stretching and strengthening activities prescribed by your caregiver, physical therapist, or athletic trainer. Use a heat pack or soak the injury in warm water. SEEK IMMEDIATE MEDICAL CARE IF:  Treatment seems to offer no benefit, or the condition worsens.  Any medications produce adverse side effects. EXERCISES RANGE OF MOTION (ROM) AND STRETCHING EXERCISES - Piriformis Syndrome These exercises may help you when beginning to rehabilitate your injury. Your symptoms may resolve with or without further involvement from your physician, physical therapist, or athletic trainer. While completing these exercises, remember:   Restoring tissue flexibility helps normal motion to return to the  joints. This allows healthier, less painful movement and activity.  An effective stretch should be held for at least 30 seconds.  A stretch should never be painful. You should only feel a gentle lengthening or release in the stretched tissue. STRETCH - Hip Rotators  Lie on your back on a firm surface. Grasp your right / left knee with your right / left hand and your ankle with your opposite hand.  Keeping your hips and shoulders firmly planted, gently pull your right / left knee and rotate your lower leg toward your opposite shoulder until you feel a stretch in your buttocks.  Hold this stretch for __________ seconds. Repeat this stretch __________ times. Complete this stretch __________ times per day. STRETCH - Iliotibial Band  On the floor or bed, lie on your side so your right / left leg is on top. Bend your knee and grab your ankle.  Slowly bring your knee back so that your thigh is in line with your trunk. Keep your heel at your buttocks and gently arch your back so your head, shoulders, and hips line up.  Slowly lower your leg so that your knee approaches the floor/bed until you feel a gentle stretch on the outside of your right / left thigh. If you do not feel a stretch and your knee will not fall farther, place the heel of your opposite foot on top of your knee and pull your thigh down farther.  Hold this stretch for __________ seconds. Repeat __________ times. Complete __________ times per day. STRENGTHENING EXERCISES - Piriformis Syndrome  These are some of the caregiver again or until your symptoms are resolved. Remember:   Strong muscles with good endurance tolerate stress better.  Do the exercises as initially prescribed by your caregiver. Progress slowly with each exercise, gradually increasing the number of repetitions and weight used under their guidance. STRENGTH - Hip  Abductors, Straight Leg Raises Be aware of your form throughout the entire exercise so that you  exercise the correct muscles. Sloppy form means that you are not strengthening the correct muscles.  Lie on your side so that your head, shoulders, knee, and hip line up. You may bend your lower knee to help maintain your balance. Your right / left leg should be on top.  Roll your hips slightly forward, so that your hips are stacked directly over each other and your right / left knee is facing forward.  Lift your top leg up 4-6 inches, leading with your heel. Be sure that your foot does not drift forward or that your knee does not roll toward the ceiling.  Hold this position for __________ seconds. You should feel the muscles in your outer hip lifting (you may not notice this until your leg begins to tire).  Slowly lower your leg to the starting position. Allow the muscles to fully relax before beginning the next repetition. Repeat __________ times. Complete this exercise __________ times per day.  STRENGTH - Hip Abductors, Quadruped  On a firm, lightly padded surface, position yourself on your hands and knees. Your hands should be directly below your shoulders and your knees should be directly below your hips.  Keeping your right / left knee bent, lift your leg out to the side. Keep your legs level and in line with your shoulders.  Position yourself on your hands and knees.  Hold for __________ seconds.  Keeping your trunk steady and your hips level, slowly lower your leg to the starting position. Repeat __________ times. Complete this exercise __________ times per day.  STRENGTH - Hip Abductors, Standing  Tie one end of a rubber exercise band/tubing to a secure surface (table, pole) and tie a loop at the other end.  Place the loop around your right / left ankle. Keeping your ankle with the band directly opposite of the secured end, step away until there is tension in the tube/band.  Hold onto a chair as needed for balance.  Keeping your back upright, your shoulders over your hips,  and your toes pointing forward, lift your right / left leg out to your side. Be sure to lift your leg with your hip muscles. Do not "throw" your leg or tip your body to lift your leg.  Slowly and with control, return to the starting position. Repeat exercise __________ times. Complete this exercise __________ times per day.  Document Released: 06/01/2005 Document Revised: 10/16/2013 Document Reviewed: 09/13/2008 [facility] Patient Information 2015 [place], [place]. This information is not intended to replace advice given to you by your health care provider. Make sure you discuss any questions you have with your health care provider.
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
## 2015-05-23 IMAGING — CR DG SHOULDER 2+V*R*
2 series · 2 of 2 positions shown · non-contrast
Comparison: None.

CLINICAL DATA: Right shoulder pain.

EXAM:
RIGHT SHOULDER - 2+ VIEW

[w shoulder external right]
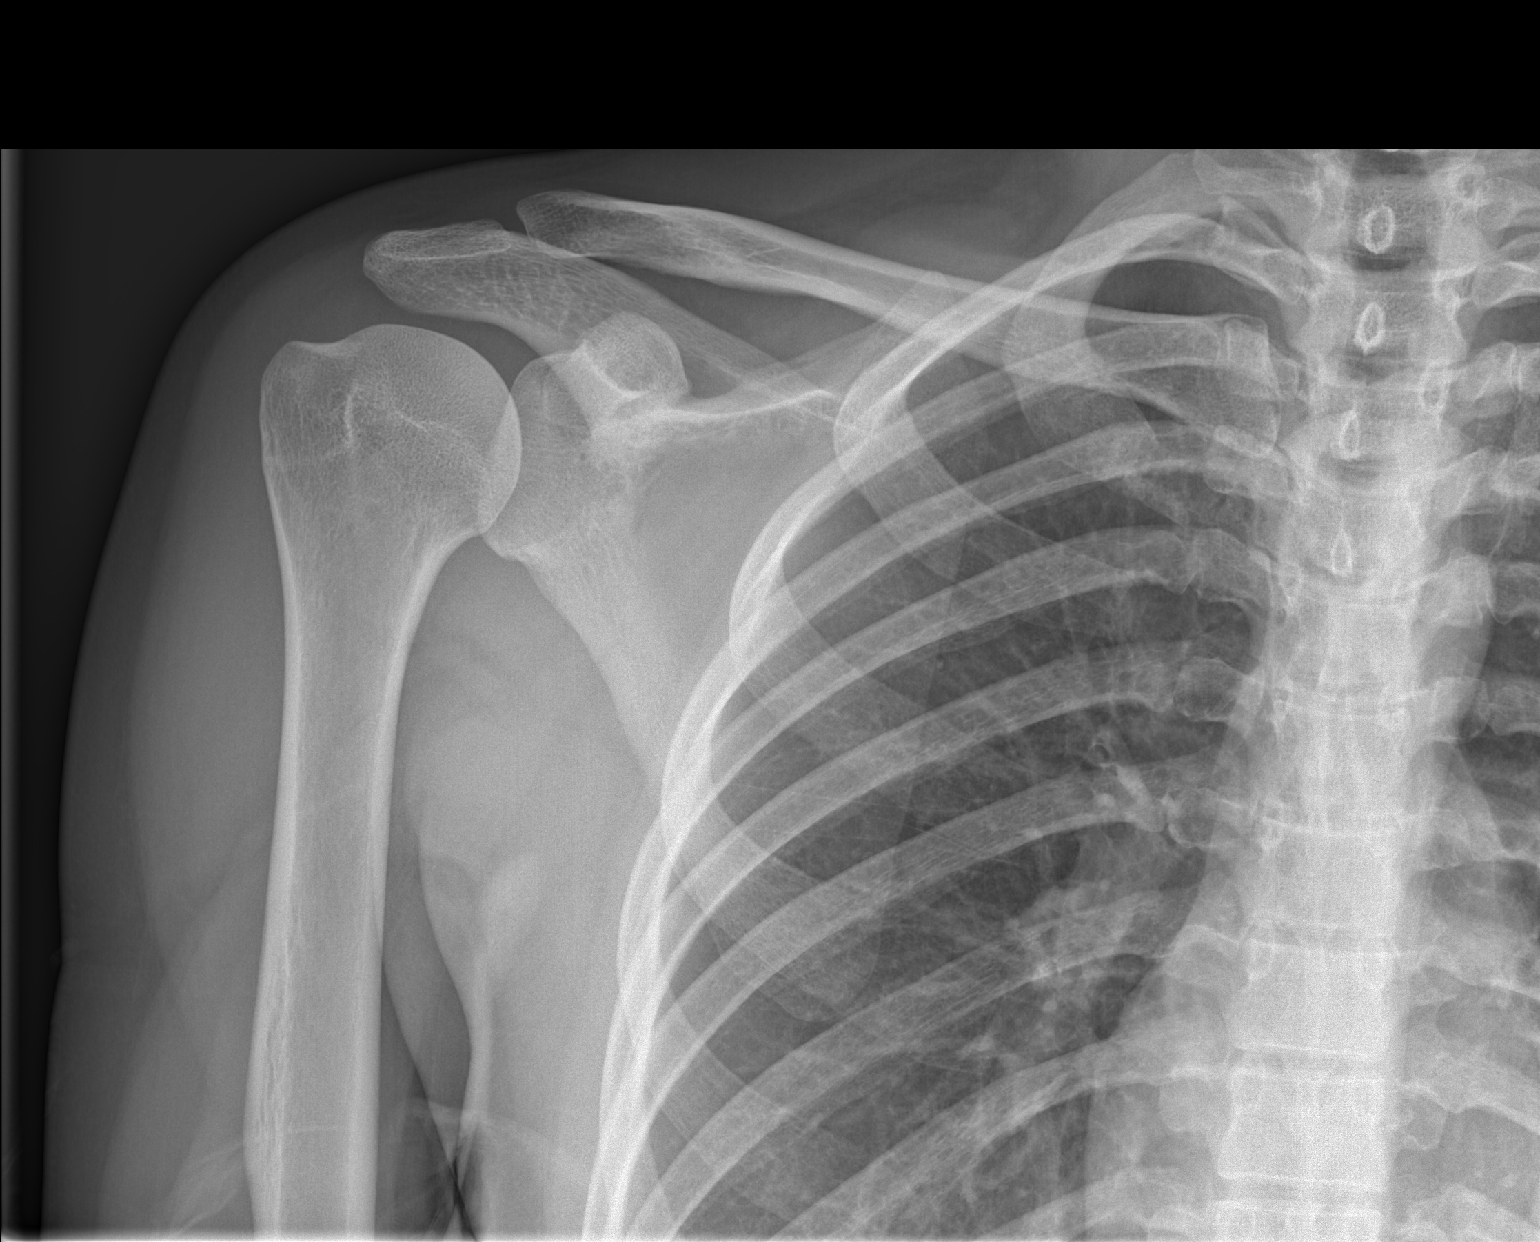

[w shoulder y-view right]
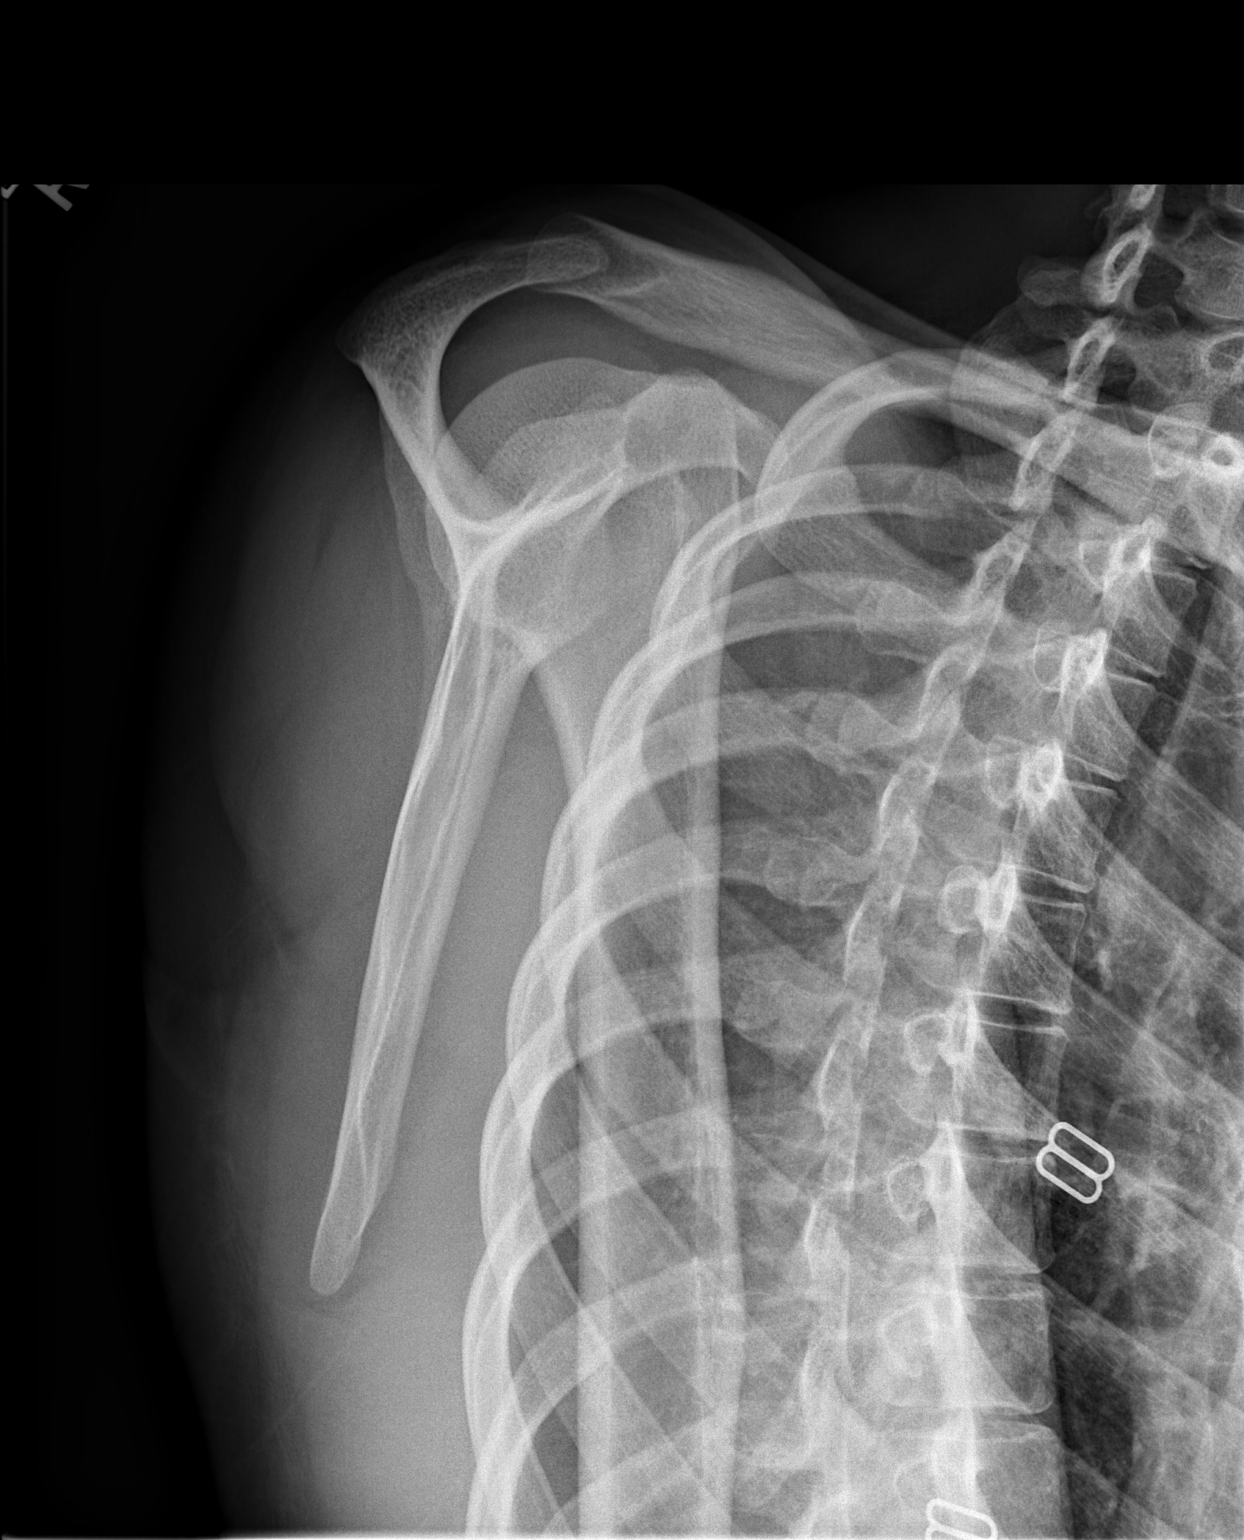

[2 of 2 positions shown; findings below may reference images not displayed]

FINDINGS: There is no evidence of fracture or dislocation. There is no
evidence of arthropathy or other focal bone abnormality. Soft
tissues are unremarkable.
IMPRESSION: Negative.

## 2016-11-02 ENCOUNTER — Encounter: Payer: No Typology Code available for payment source | Admitting: Family Medicine

## 2016-11-13 ENCOUNTER — Encounter: Payer: Self-pay | Admitting: Family Medicine

## 2016-11-13 ENCOUNTER — Other Ambulatory Visit (HOSPITAL_COMMUNITY)
Admission: RE | Admit: 2016-11-13 | Discharge: 2016-11-13 | Disposition: A | Discharge status: Home / Self Care | Payer: No Typology Code available for payment source | Source: Ambulatory Visit | Attending: Family Medicine | Admitting: Family Medicine

## 2016-11-13 ENCOUNTER — Ambulatory Visit (INDEPENDENT_AMBULATORY_CARE_PROVIDER_SITE_OTHER): Payer: No Typology Code available for payment source | Admitting: Family Medicine

## 2016-11-13 VITALS — BP 116/68 | HR 78 | Ht 67.5 in | Wt 171.0 lb

## 2016-11-13 DIAGNOSIS — R5383 Other fatigue: Secondary | ICD-10-CM | POA: Diagnosis not present

## 2016-11-13 DIAGNOSIS — M5441 Lumbago with sciatica, right side: Secondary | ICD-10-CM

## 2016-11-13 DIAGNOSIS — Z124 Encounter for screening for malignant neoplasm of cervix: Secondary | ICD-10-CM | POA: Diagnosis not present

## 2016-11-13 DIAGNOSIS — R51 Headache: Secondary | ICD-10-CM

## 2016-11-13 DIAGNOSIS — R519 Headache, unspecified: Secondary | ICD-10-CM

## 2016-11-13 DIAGNOSIS — Z87898 Personal history of other specified conditions: Secondary | ICD-10-CM | POA: Insufficient documentation

## 2016-11-13 DIAGNOSIS — G8929 Other chronic pain: Secondary | ICD-10-CM | POA: Diagnosis not present

## 2016-11-13 DIAGNOSIS — Z113 Encounter for screening for infections with a predominantly sexual mode of transmission: Secondary | ICD-10-CM | POA: Diagnosis not present

## 2016-11-13 DIAGNOSIS — Z862 Personal history of diseases of the blood and blood-forming organs and certain disorders involving the immune mechanism: Secondary | ICD-10-CM | POA: Diagnosis not present

## 2016-11-13 DIAGNOSIS — R0683 Snoring: Secondary | ICD-10-CM

## 2016-11-13 DIAGNOSIS — Z8742 Personal history of other diseases of the female genital tract: Secondary | ICD-10-CM

## 2016-11-13 DIAGNOSIS — Z0000 Encounter for general adult medical examination without abnormal findings: Secondary | ICD-10-CM

## 2016-11-13 LAB — CBC WITH DIFFERENTIAL/PLATELET
BASOS ABS: 0 {cells}/uL (ref 0–200)
BASOS PCT: 0 %
Eosinophils Absolute: 165 cells/uL (ref 15–500)
Eosinophils Relative: 3 %
HEMATOCRIT: 36.5 % (ref 35.0–45.0)
Hemoglobin: 12.2 g/dL (ref 11.7–15.5)
LYMPHS ABS: 1980 {cells}/uL (ref 850–3900)
Lymphocytes Relative: 36 %
MCH: 30.6 pg (ref 27.0–33.0)
MCHC: 33.4 g/dL (ref 32.0–36.0)
MCV: 91.5 fL (ref 80.0–100.0)
MONOS PCT: 6 %
MPV: 9.3 fL (ref 7.5–12.5)
Monocytes Absolute: 330 cells/uL (ref 200–950)
NEUTROS ABS: 3025 {cells}/uL (ref 1500–7800)
Neutrophils Relative %: 55 %
PLATELETS: 341 10*3/uL (ref 140–400)
RBC: 3.99 MIL/uL (ref 3.80–5.10)
RDW: 13.1 % (ref 11.0–15.0)
WBC: 5.5 10*3/uL (ref 4.0–10.5)

## 2016-11-13 LAB — POCT URINALYSIS DIPSTICK
BILIRUBIN UA: NEGATIVE
Blood, UA: NEGATIVE
Glucose, UA: NEGATIVE
KETONES UA: NEGATIVE
Nitrite, UA: NEGATIVE
PH UA: 6 (ref 5.0–8.0)
Protein, UA: NEGATIVE
Spec Grav, UA: >=1.03 — AB (ref 1.010–1.025)
Urobilinogen, UA: NEGATIVE E.U./dL — AB

## 2016-11-13 LAB — COMPREHENSIVE METABOLIC PANEL
ALT: 9 U/L (ref 6–29)
AST: 13 U/L (ref 10–30)
Albumin: 4 g/dL (ref 3.6–5.1)
Alkaline Phosphatase: 44 U/L (ref 33–115)
BILIRUBIN TOTAL: 0.7 mg/dL (ref 0.2–1.2)
BUN: 6 mg/dL — AB (ref 7–25)
CO2: 26 mmol/L (ref 20–31)
CREATININE: 0.8 mg/dL (ref 0.50–1.10)
Calcium: 9.3 mg/dL (ref 8.6–10.2)
Chloride: 103 mmol/L (ref 98–110)
Glucose, Bld: 84 mg/dL (ref 65–99)
Potassium: 4.3 mmol/L (ref 3.5–5.3)
SODIUM: 136 mmol/L (ref 135–146)
Total Protein: 7.1 g/dL (ref 6.1–8.1)

## 2016-11-13 NOTE — Patient Instructions (Addendum)
Increase your water intake, eat small frequent meals with protein and get 8 hours of sleep per night. Avoid any fluctuations in caffeine. If you are still having headaches then let me know.  Keep a headache journal and look for any triggers or new symptoms.   Cut back on sugary drinks.  Continue exercising.   We will call you with your lab results.    Preventative Care for Adults - Female      MAINTAIN REGULAR HEALTH EXAMS:  A routine yearly physical is a good way to check in with your primary care provider about your health and preventive screening. It is also an opportunity to share updates about your health and any concerns you have, and receive a thorough all-over exam.   Most health insurance companies pay for at least some preventative services.  Check with your health plan for specific coverages.  WHAT PREVENTATIVE SERVICES DO WOMEN NEED?  Adult women should have their weight and blood pressure checked regularly.   Women age 20 and older should have their cholesterol levels checked regularly.  Women should be screened for cervical cancer with a Pap smear and pelvic exam beginning at either age 34, or 3 years after they become sexually activity.    Breast cancer screening generally begins at age 35 with a mammogram and breast exam by your primary care provider.    Beginning at age 80 and continuing to age 20, women should be screened for colorectal cancer.  Certain people may need continued testing until age 78.  Updating vaccinations is part of preventative care.  Vaccinations help protect against diseases such as the flu.  Osteoporosis is a disease in which the bones lose minerals and strength as we age. Women ages 27 and over should discuss this with their caregivers, as should women after menopause who have other risk factors.  Lab tests are generally done as part of preventative care to screen for anemia and blood disorders, to screen for problems with the kidneys and  liver, to screen for bladder problems, to check blood sugar, and to check your cholesterol level.  Preventative services generally include counseling about diet, exercise, avoiding tobacco, drugs, excessive alcohol consumption, and sexually transmitted infections.    GENERAL RECOMMENDATIONS FOR GOOD HEALTH:  Healthy diet:  Eat a variety of foods, including fruit, vegetables, animal or vegetable protein, such as meat, fish, chicken, and eggs, or beans, lentils, tofu, and grains, such as rice.  Drink plenty of water daily.  Decrease saturated fat in the diet, avoid lots of red meat, processed foods, sweets, fast foods, and fried foods.  Exercise:  Aerobic exercise helps maintain good heart health. At least 30-40 minutes of moderate-intensity exercise is recommended. For example, a brisk walk that increases your heart rate and breathing. This should be done on most days of the week.   Find a type of exercise or a variety of exercises that you enjoy so that it becomes a part of your daily life.  Examples are running, walking, swimming, water aerobics, and biking.  For motivation and support, explore group exercise such as aerobic class, spin class, Zumba, Yoga,or  martial arts, etc.    Set exercise goals for yourself, such as a certain weight goal, walk or run in a race such as a 5k walk/run.  Speak to your primary care provider about exercise goals.  Disease prevention:  If you smoke or chew tobacco, find out from your caregiver how to quit. It can literally save your  life, no matter how long you have been a tobacco user. If you do not use tobacco, never begin.   Maintain a healthy diet and normal weight. Increased weight leads to problems with blood pressure and diabetes.   The Body Mass Index or BMI is a way of measuring how much of your body is fat. Having a BMI above 27 increases the risk of heart disease, diabetes, hypertension, stroke and other problems related to obesity. Your  caregiver can help determine your BMI and based on it develop an exercise and dietary program to help you achieve or maintain this important measurement at a healthful level.  High blood pressure causes heart and blood vessel problems.  Persistent high blood pressure should be treated with medicine if weight loss and exercise do not work.   Fat and cholesterol leaves deposits in your arteries that can block them. This causes heart disease and vessel disease elsewhere in your body.  If your cholesterol is found to be high, or if you have heart disease or certain other medical conditions, then you may need to have your cholesterol monitored frequently and be treated with medication.   Ask if you should have a cardiac stress test if your history suggests this. A stress test is a test done on a treadmill that looks for heart disease. This test can find disease prior to there being a problem.  Menopause can be associated with physical symptoms and risks. Hormone replacement therapy is available to decrease these. You should talk to your caregiver about whether starting or continuing to take hormones is right for you.   Osteoporosis is a disease in which the bones lose minerals and strength as we age. This can result in serious bone fractures. Risk of osteoporosis can be identified using a bone density scan. Women ages 61 and over should discuss this with their caregivers, as should women after menopause who have other risk factors. Ask your caregiver whether you should be taking a calcium supplement and Vitamin D, to reduce the rate of osteoporosis.   Avoid drinking alcohol in excess (more than two drinks per day).  Avoid use of street drugs. Do not share needles with anyone. Ask for professional help if you need assistance or instructions on stopping the use of alcohol, cigarettes, and/or drugs.  Brush your teeth twice a day with fluoride toothpaste, and floss once a day. Good oral hygiene prevents tooth  decay and gum disease. The problems can be painful, unattractive, and can cause other health problems. Visit your dentist for a routine oral and dental check up and preventive care every 6-12 months.   Look at your skin regularly.  Use a mirror to look at your back. Notify your caregivers of changes in moles, especially if there are changes in shapes, colors, a size larger than a pencil eraser, an irregular border, or development of new moles.  Safety:  Use seatbelts 100% of the time, whether driving or as a passenger.  Use safety devices such as hearing protection if you work in environments with loud noise or significant background noise.  Use safety glasses when doing any work that could send debris in to the eyes.  Use a helmet if you ride a bike or motorcycle.  Use appropriate safety gear for contact sports.  Talk to your caregiver about gun safety.  Use sunscreen with a SPF (or skin protection factor) of 15 or greater.  Lighter skinned people are at a greater risk of skin cancer. Don't  forget to also wear sunglasses in order to protect your eyes from too much damaging sunlight. Damaging sunlight can accelerate cataract formation.   Practice safe sex. Use condoms. Condoms are used for birth control and to help reduce the spread of sexually transmitted infections (or STIs).  Some of the STIs are gonorrhea (the clap), chlamydia, syphilis, trichomonas, herpes, HPV (human papilloma virus) and HIV (human immunodeficiency virus) which causes AIDS. The herpes, HIV and HPV are viral illnesses that have no cure. These can result in disability, cancer and death.   Keep carbon monoxide and smoke detectors in your home functioning at all times. Change the batteries every 6 months or use a model that plugs into the wall.   Vaccinations:  Stay up to date with your tetanus shots and other required immunizations. You should have a booster for tetanus every 10 years. Be sure to get your flu shot every year,  since 5%-20% of the [place] population comes down with the flu. The flu vaccine changes each year, so being vaccinated once is not enough. Get your shot in the fall, before the flu season peaks.   Other vaccines to consider:  Human Papilloma Virus or HPV causes cancer of the cervix, and other infections that can be transmitted from person to person. There is a vaccine for HPV, and females should get immunized between the ages of 65 and 35. It requires a series of 3 shots.   Pneumococcal vaccine to protect against certain types of pneumonia.  This is normally recommended for adults age 59 or older.  However, adults younger than 29 years old with certain underlying conditions such as diabetes, heart or lung disease should also receive the vaccine.  Shingles vaccine to protect against Varicella Zoster if you are older than age 25, or younger than 30 years old with certain underlying illness.  Hepatitis A vaccine to protect against a form of infection of the liver by a virus acquired from food.  Hepatitis B vaccine to protect against a form of infection of the liver by a virus acquired from blood or body fluids, particularly if you work in health care.  If you plan to travel internationally, check with your local health department for specific vaccination recommendations.  Cancer Screening:  Breast cancer screening is essential to preventive care for women. All women age 78 and older should perform a breast self-exam every month. At age 57 and older, women should have their caregiver complete a breast exam each year. Women at ages 37 and older should have a mammogram (x-ray film) of the breasts. Your caregiver can discuss how often you need mammograms.    Cervical cancer screening includes taking a Pap smear (sample of cells examined under a microscope) from the cervix (end of the uterus). It also includes testing for HPV (Human Papilloma Virus, which can cause cervical cancer). Screening and a pelvic  exam should begin at age 31, or 3 years after a woman becomes sexually active. Screening should occur every year, with a Pap smear but no HPV testing, up to age 32. After age 64, you should have a Pap smear every 3 years with HPV testing, if no HPV was found previously.   Most routine colon cancer screening begins at the age of 43. On a yearly basis, doctors may provide special easy to use take-home tests to check for hidden blood in the stool. Sigmoidoscopy or colonoscopy can detect the earliest forms of colon cancer and is life saving. These tests use  a small camera at the end of a tube to directly examine the colon. Speak to your caregiver about this at age 90, when routine screening begins (and is repeated every 5 years unless early forms of pre-cancerous polyps or small growths are found).

## 2016-11-13 NOTE — Progress Notes (Signed)
Subjective:    Patient ID: Jessica Mahoney, female    DOB: 1986-01-14, 31 y.o.   MRN: [id number]  HPI Chief Complaint  Patient presents with  . new pt    new pt, cpe, headaches and sciatia pain since 2014, pap smear was august 2014   She is new to the practice and here for a complete physical exam. Previous medical care: no PCP. No medical care since 2014.  Last CPE: years ago   Complains of daily morning headaches for the past 3-4 weeks. Frontal or bilateral temporal regions. No aura. No photosensitivity, phonophobia, nausea or vomiting.  Takes Advil some days and this always takes her headache away.   Sleeps 10 hours of sleep per night. Wakes up tired. Daytime sleepiness. Snores.  She is not well hydrated. Drinks mostly juice and only 1 glass of water.   Other providers: [facility]. [facility] in the past.   Past medical history: history of back pain with "pinched nerve" that flared up with her pregnancy. Continues to have right low back pain and shoots down into her right posterior leg to her mid-thigh. She did not follow up with [facility] as recommended.   History of abnormal pap smear. Has not seen OB/GYN in 4 years.   Social history: Lives with boyfriend and 46 year old son, works and is self employed  Diet: unhealthy  Excerise: 1 hour per day.   Immunizations: Tdap in the past 2 years.   Health maintenance:  Mammogram: N/A Colonoscopy: N/A Last Pap Smear: 2014  Last Menstrual cycle: Oct 26, 2016 Pregnancies: 1 Last Dental Exam:  2012  Last Eye Exam: 9th grade   Wears seatbelt always, uses sunscreen, smoke detectors in home and functioning, does not text while driving and feels safe in home environment.   Depression screen PHQ 2/9 11/13/2016  Decreased Interest 0  Down, Depressed, Hopeless 0  PHQ - 2 Score 0     Reviewed allergies, medications, past medical, surgical, family, and social history.    Review of Systems Review of  Systems Constitutional: -fever, -chills, -sweats, -unexpected weight change,-fatigue ENT: -runny nose, -ear pain, -sore throat Cardiology:  -chest pain, -palpitations, -edema Respiratory: -cough, -shortness of breath, -wheezing Gastroenterology: -abdominal pain, -nausea, -vomiting, -diarrhea, -constipation  Hematology: -bleeding or bruising problems Musculoskeletal: -arthralgias, -myalgias, -joint swelling, -back pain Ophthalmology: -vision changes Urology: -dysuria, -difficulty urinating, -hematuria, -urinary frequency, -urgency Neurology: +headache, -weakness, -tingling, -numbness       Objective:   Physical Exam BP 116/68   Pulse 78   Ht 5' 7.5" (1.715 m)   Wt 171 lb (77.6 kg)   LMP 10/26/2016   Breastfeeding? No   BMI 26.39 kg/m   General Appearance:    Alert, cooperative, no distress, appears stated age  Head:    Normocephalic, without obvious abnormality, atraumatic  Eyes:    PERRL, conjunctiva/corneas clear, EOM's intact, fundi    benign  Ears:    Normal TM's and external ear canals  Nose:   Nares normal, mucosa normal, no drainage or sinus   tenderness  Throat:   Lips, mucosa, and tongue normal; teeth and gums normal  Neck:   Supple, no lymphadenopathy;  thyroid:  no   enlargement/tenderness/nodules; no carotid   bruit or JVD  Back:    Spine nontender, no curvature, ROM normal, no CVA     Tenderness. Tenderness over right SI joint. Negative straight leg raise.   Lungs:     Clear to  auscultation bilaterally without wheezes, rales or     ronchi; respirations unlabored  Chest Wall:    No tenderness or deformity   Heart:    Regular rate and rhythm, S1 and S2 normal, no murmur, rub   or gallop  Breast Exam:    No tenderness, masses, or nipple discharge or inversion.      No axillary lymphadenopathy  Abdomen:     Soft, non-tender, nondistended, normoactive bowel sounds,    no masses, no hepatosplenomegaly  Genitalia:    Normal external genitalia without lesions.  BUS and  vagina normal; cervix without lesions, or cervical motion tenderness. No abnormal vaginal discharge.  Uterus and adnexa not enlarged, nontender, no masses.  Pap performed. Chaperone present.   Rectal:    Not performed due to age<40 and no related complaints  Extremities:   No clubbing, cyanosis or edema  Pulses:   2+ and symmetric all extremities  Skin:   Skin color, texture, turgor normal, no rashes or lesions  Lymph nodes:   Cervical, supraclavicular, and axillary nodes normal  Neurologic:   CNII-XII intact, normal strength, sensation and gait; reflexes 2+ and symmetric throughout          Psych:   Normal mood, affect, hygiene and grooming.    Urinalysis dipstick: spec grav >1.030, leu 2+.      Assessment & Plan:  Routine general medical examination at a health care facility - Plan: Urinalysis Dipstick, CBC with Differential/Platelet, Comprehensive metabolic panel  Screening for cervical cancer - Plan: Cytology - PAP  Chronic right-sided low back pain with right-sided sciatica  Daily headache - Plan: Split night study  Fatigue, unspecified type - Plan: CBC with Differential/Platelet, Comprehensive metabolic panel, VITAMIN D 25 Hydroxy (Vit-D Deficiency, Fractures), TSH, Split night study  History of anemia - Plan: CBC with Differential/Platelet  Screen for STD (sexually transmitted disease) - Plan: HIV antibody, RPR  History of abnormal cervical Pap smear - Plan: Cytology - PAP  Snoring - Plan: Split night study  She appears to be doing well overall. Happy in her relationship and job.  She plans to follow up with [facility] as recommended for her right low back pain and sciatica.  Recent daily morning headaches - counseled on increasing water and cutting back on sugary drinks, avoid skipping meals and getting regular sleep. She will keep a headache journal and let me know if she is not improving or if she notices any new symptoms. Does not appear to be having migrainous type  headaches.  She is having excessive daytime fatigue in spite of sleeping 9-10 hours per night. She snores. Unknown if she is having periods of apnea. Epworth scale is elevated at 13. Plan to send for a sleep study. This would also explain morning headaches.  No sign of depression.  Up to date with immunizations.  Updated pap smear today. STD screening done per request.  Will check labs and follow up.

## 2016-11-14 LAB — TSH: TSH: 0.88 mIU/L

## 2016-11-14 LAB — RPR

## 2016-11-14 LAB — HIV ANTIBODY (ROUTINE TESTING W REFLEX): HIV 1&2 Ab, 4th Generation: NONREACTIVE

## 2016-11-14 LAB — VITAMIN D 25 HYDROXY (VIT D DEFICIENCY, FRACTURES): Vit D, 25-Hydroxy: 16 ng/mL — ABNORMAL LOW (ref 30–100)

## 2016-11-16 ENCOUNTER — Other Ambulatory Visit: Payer: Self-pay | Admitting: Family Medicine

## 2016-11-16 ENCOUNTER — Encounter: Payer: Self-pay | Admitting: Family Medicine

## 2016-11-16 DIAGNOSIS — E559 Vitamin D deficiency, unspecified: Secondary | ICD-10-CM | POA: Insufficient documentation

## 2016-11-16 LAB — CYTOLOGY - PAP
Chlamydia: NEGATIVE
DIAGNOSIS: NEGATIVE
NEISSERIA GONORRHEA: NEGATIVE

## 2016-11-16 MED ORDER — VITAMIN D (ERGOCALCIFEROL) 1.25 MG (50000 UNIT) PO CAPS: 50000.0000 [IU] | ORAL_CAPSULE | ORAL | 0 refills | Status: AC

## 2016-11-17 ENCOUNTER — Encounter: Payer: Self-pay | Admitting: Internal Medicine

## 2017-01-25 ENCOUNTER — Other Ambulatory Visit: Payer: Self-pay | Admitting: Family Medicine

## 2017-01-25 DIAGNOSIS — E559 Vitamin D deficiency, unspecified: Secondary | ICD-10-CM

## 2019-01-03 ENCOUNTER — Telehealth: Payer: Self-pay | Admitting: Family Medicine

## 2019-01-03 NOTE — Telephone Encounter (Signed)
Dismissal letter in guarantor snapshot  °

## 2023-10-04 ENCOUNTER — Ambulatory Visit: Payer: Self-pay

## 2023-10-04 NOTE — Telephone Encounter (Signed)
  Chief Complaint: depression Symptoms: depression/fatigue Frequency: N/A. Pertinent Negatives: Patient's mother denies patient thoughts of self harm or harm to others. Disposition: [] ED /[x] Behavioral Health Urgent Care (no appt availability in office) / [] Appointment(In office/virtual)/ []  [facility]/ [] Home Care/ [] Refused Recommended Disposition /[] [facility]/ []  Follow-up with PCP Additional Notes: Patient's mother called in earlier for triage. Called mother, Wallene Gum, back. She states the patient was requesting to speak with an in person counselor. Patient not established with PCP. Mother given resources on [facility] Behavior[facility]gent Care and Outpatient offices and locations. Mother requested if there are any private behavioral health resources that her PCP could recommend.  Reason for Disposition  Requesting to talk with a counselor (mental health worker, psychiatrist, etc.)  Answer Assessment - Initial Assessment Questions 1. CONCERN: "What happened that made you call today?"     Requesting a referral for a counselor. Patient is going thru a separation and is burnt out from work.   2. DEPRESSION SYMPTOM SCREENING: "How are you feeling overall?" (e.g., decreased energy, increased sleeping or difficulty sleeping, difficulty concentrating, feelings of sadness, guilt, hopelessness, or worthlessness)     Mother states overall she has been able to do stuff but doesn't have the desire, lying around more than usual.  3. RISK OF HARM - SUICIDAL IDEATION:  "Do you ever have thoughts of hurting or killing yourself?"  (e.g., yes, no, no but preoccupation with thoughts about death)   - INTENT:  "Do you have thoughts of hurting or killing yourself right NOW?" (e.g., yes, no, N/A)   - PLAN: "Do you have a specific plan for how you would do this?" (e.g., gun, knife, overdose, no plan, N/A)     Denies.  4. RISK OF HARM - HOMICIDAL IDEATION:  "Do you ever have thoughts of  hurting or killing someone else?"  (e.g., yes, no, no but preoccupation with thoughts about death)   - INTENT:  "Do you have thoughts of hurting or killing someone right NOW?" (e.g., yes, no, N/A)   - PLAN: "Do you have a specific plan for how you would do this?" (e.g., gun, knife, no plan, N/A)      Denies.  5. FUNCTIONAL IMPAIRMENT: "How have things been going for you overall? Have you had more difficulty than usual doing your normal daily activities?"  (e.g., better, same, worse; self-care, school, work, interactions)     Mother states she has just been wanting to lie down a lot and not being interested.  6. SUPPORT: "Who is with you now?" "Who do you live with?" "Do you have family or friends who you can talk to?"      Mother is a support to patient.  7. THERAPIST: "Do you have a counselor or therapist? Name?"     In the past she has had one, virtually. She is looking to get established.  8. STRESSORS: "Has there been any new stress or recent changes in your life?"     Work and relationship.  9. ALCOHOL USE OR SUBSTANCE USE (DRUG USE): "Do you drink alcohol or use any illegal drugs?"     Denies drugs, alcohol occasionally.  10. OTHER: "Do you have any other physical symptoms right now?" (e.g., fever)       Denies.  11. PREGNANCY: "Is there any chance you are pregnant?" "When was your last menstrual period?"       Unsure.  Protocols used: Depression-A-AH

## 2024-02-27 ENCOUNTER — Emergency Department (HOSPITAL_BASED_OUTPATIENT_CLINIC_OR_DEPARTMENT_OTHER): Payer: Self-pay

## 2024-02-27 ENCOUNTER — Other Ambulatory Visit: Payer: Self-pay

## 2024-02-27 ENCOUNTER — Encounter (HOSPITAL_BASED_OUTPATIENT_CLINIC_OR_DEPARTMENT_OTHER): Payer: Self-pay

## 2024-02-27 ENCOUNTER — Emergency Department (HOSPITAL_BASED_OUTPATIENT_CLINIC_OR_DEPARTMENT_OTHER)
Admission: EM | Admit: 2024-02-27 | Discharge: 2024-02-27 | Disposition: A | Discharge status: Home / Self Care | Payer: Self-pay | Attending: Emergency Medicine | Admitting: Emergency Medicine

## 2024-02-27 DIAGNOSIS — S61411A Laceration without foreign body of right hand, initial encounter: Secondary | ICD-10-CM | POA: Insufficient documentation

## 2024-02-27 DIAGNOSIS — M79641 Pain in right hand: Secondary | ICD-10-CM

## 2024-02-27 DIAGNOSIS — W228XXA Striking against or struck by other objects, initial encounter: Secondary | ICD-10-CM | POA: Insufficient documentation

## 2024-02-27 MED ORDER — OXYCODONE HCL 5 MG PO TABS: 5.0000 mg | ORAL_TABLET | ORAL | 0 refills | Status: AC | PRN

## 2024-02-27 MED ORDER — HYDROCODONE-ACETAMINOPHEN 5-325 MG PO TABS
1.0000 | ORAL_TABLET | Freq: Once | ORAL | Status: AC
  Administered 2024-02-27: 1 via ORAL
  Filled 2024-02-27: qty 1

## 2024-02-27 MED ORDER — CEPHALEXIN 500 MG PO CAPS: 500.0000 mg | ORAL_CAPSULE | Freq: Two times a day (BID) | ORAL | 0 refills | Status: AC

## 2024-02-27 MED ORDER — BACITRACIN ZINC 500 UNIT/GM EX OINT: 1.0000 | TOPICAL_OINTMENT | Freq: Two times a day (BID) | CUTANEOUS | 0 refills | Status: AC

## 2024-02-27 NOTE — ED Triage Notes (Signed)
 Pt reports punching a door yesterday. Pt reports pain and swelling to R hand.

## 2024-02-27 NOTE — ED Provider Notes (Signed)
 [place] EMERGENCY DEPARTMENT AT [facility] Provider Note   CSN: [id number] Arrival date & time: 02/27/24  8397     Patient presents with: Hand Injury   Jessica Mahoney is a 38 y.o. female here for evaluation of right hand pain after punching wall yesterday.  Has small laceration over right fifth metacarpal.  Some surrounding erythema, swelling to the hand.  She is right-hand dominant.  Tetanus up-to-date.  Tenderness when palpating the hand.  Nontender wrist, forearm.  Full range of motion of   HPI     Prior to Admission medications   Medication Sig Start Date End Date Taking? Authorizing Provider  bacitracin  ointment Apply 1 Application topically 2 (two) times daily. 02/27/24  Yes Dorothie Wah A, PA-C  cephALEXin  (KEFLEX ) 500 MG capsule Take 1 capsule (500 mg total) by mouth 2 (two) times daily for 5 days. 02/27/24 03/03/24 Yes Loyal Rudy A, PA-C  oxyCODONE  (ROXICODONE ) 5 MG immediate release tablet Take 1 tablet (5 mg total) by mouth every 4 (four) hours as needed for severe pain (pain score 7-10). 02/27/24  Yes Desmen Schoffstall A, PA-C  Vitamin D , Ergocalciferol , (DRISDOL ) 50000 units CAPS capsule Take 1 capsule (50,000 Units total) by mouth every 7 (seven) days. 11/16/16   Henson, Vickie L, NP-C    Allergies: Patient has no known allergies.    Review of Systems  Constitutional: Negative.   HENT: Negative.    Respiratory: Negative.    Cardiovascular: Negative.   Gastrointestinal: Negative.   Genitourinary: Negative.   Musculoskeletal:        Right hand pain fifth metacarpal  Skin:  Positive for wound.  Neurological: Negative.   All other systems reviewed and are negative.   Updated Vital Signs BP 108/79 (BP Location: Left Arm)   Pulse 82   Temp 98 F (36.7 C)   Resp 16   Ht 5' 7 (1.702 m)   Wt 87.5 kg   SpO2 99%   BMI 30.23 kg/m   Physical Exam Vitals and nursing note reviewed.  Constitutional:      General: She is not in acute  distress.    Appearance: She is well-developed. She is not ill-appearing.  HENT:     Head: Atraumatic.  Eyes:     Pupils: Pupils are equal, round, and reactive to light.  Cardiovascular:     Rate and Rhythm: Normal rate.     Pulses:          Radial pulses are 2+ on the right side and 2+ on the left side.  Pulmonary:     Effort: No respiratory distress.  Abdominal:     General: There is no distension.  Musculoskeletal:        General: Normal range of motion.       Hands:     Cervical back: Normal range of motion.     Comments: Tenderness right fifth metacarpal surrounding soft tissue edema.  Nontender wrist, digits  Skin:    General: Skin is warm and dry.     Capillary Refill: Capillary refill takes less than 2 seconds.     Findings: Erythema and laceration present.     Comments: Soft tissue swelling with 5 mm laceration over fifth metacarpal 2 cm rounded erythema surrounding however no fluctuance or induration.  No drainage.  No crepitus.  Compartments soft.  Neurological:     General: No focal deficit present.     Mental Status: She is alert.     Sensory: Sensation  is intact.     Motor: Motor function is intact.     Gait: Gait is intact.     Comments: Intact sensation  Psychiatric:        Mood and Affect: Mood normal.     (all labs ordered are listed, but only abnormal results are displayed) Labs Reviewed - No data to display  EKG: None  Radiology: DG Hand Complete Right Result Date: 02/27/2024 EXAM: 3 or more VIEW(S) XRAY OF THE HAND 02/27/2024 04:40:00 PM COMPARISON: None available. CLINICAL HISTORY: Hand injury. FINDINGS: BONES AND JOINTS: No acute fracture. No focal osseous lesion. No joint dislocation. SOFT TISSUES: Soft tissue swelling of the fifth digit. IMPRESSION: 1. No acute osseous abnormality. 2. Soft tissue swelling of the fifth digit. Electronically signed by: Lonni Necessary MD 02/27/2024 05:20 PM EDT RP Workstation: [id number]     .Splint  Application  Date/Time: 02/27/2024 5:52 PM  Performed by: Edie Rosebud LABOR, PA-C Authorized by: Edie Rosebud LABOR, PA-C   Consent:    Consent obtained:  Verbal   Consent given by:  Patient   Risks, benefits, and alternatives were discussed: yes     Risks discussed:  Discoloration, numbness, pain and swelling   Alternatives discussed:  No treatment, delayed treatment, alternative treatment, referral and observation Universal protocol:    Procedure explained and questions answered to patient or proxy's satisfaction: yes     Relevant documents present and verified: yes     Test results available: yes     Imaging studies available: yes     Required blood products, implants, devices, and special equipment available: yes     Site/side marked: yes     Immediately prior to procedure a time out was called: yes     Patient identity confirmed:  Verbally with patient Pre-procedure details:    Distal neurologic exam:  Normal   Distal perfusion: distal pulses strong and brisk capillary refill   Procedure details:    Location:  Hand   Hand location:  R hand   Strapping: no     Cast type:  Short arm   Upper extremity splint type: velcro wrist splint.   Supplies:  Prefabricated splint   Attestation: Splint applied and adjusted personally by me   Post-procedure details:    Distal neurologic exam:  Normal   Distal perfusion: distal pulses strong and brisk capillary refill     Procedure completion:  Tolerated well, no immediate complications   Post-procedure imaging: not applicable      Medications Ordered in the ED  HYDROcodone -acetaminophen  (NORCO/VICODIN) 5-325 MG per tablet 1 tablet (1 tablet Oral Given 02/27/24 1718)   38 here for evaluation of right hand pain after punching a wall yesterday about 3 PM.  She suffered superficial laceration over her right fifth metacarpal.  Today she noted some pain and swelling to her hand.  She is right-hand dominant.  Here she is neurovascularly intact  has about a 0.5 cm superficial laceration however does have about 2 cm area of surrounding erythema, no fluctuance induration.  No drainage.  She is neurovascularly intact.  I some overall tenderness to that lateral aspect of her hand.  Will plan on imaging.  Unfortunately laceration about 26 hours old.  Discussed not closing due to risk of infection.  Given erythema will cover with Keflex , bacitracin .  Imaging personally viewed and interpreted X-ray without fracture, dislocation does show some soft tissue swelling Discussed results with patient, family in room.  She was placed with Velcro wrist  splint due to pain.  Will have her follow-up with hand surgery, return for any worsening symptoms  Low suspicion for septic joint, gout, open fracture, necrotizing infection, compartment syndrome, occult fracture, dislocation  The patient has been appropriately medically screened and/or stabilized in the ED. I have low suspicion for any other emergent medical condition which would require further screening, evaluation or treatment in the ED or require inpatient management.  Patient is hemodynamically stable and in no acute distress.  Patient able to ambulate in department prior to ED.  Evaluation does not show acute pathology that would require ongoing or additional emergent interventions while in the emergency department or further inpatient treatment.  I have discussed the diagnosis with the patient and answered all questions.  Pain is been managed while in the emergency department and patient has no further complaints prior to discharge.  Patient is comfortable with plan discussed in room and is stable for discharge at this time.  I have discussed strict return precautions for returning to the emergency department.  Patient was encouraged to follow-up with PCP/specialist refer to at discharge.                                     Medical Decision Making Amount and/or Complexity of Data  Reviewed Independent Historian: friend External Data Reviewed: labs, radiology and notes. Radiology: ordered and independent interpretation performed. Decision-making details documented in ED Course.  Risk OTC drugs. Prescription drug management. Decision regarding hospitalization. Diagnosis or treatment significantly limited by social determinants of health.        Final diagnoses:  Right hand pain  Laceration of right hand without foreign body, initial encounter    ED Discharge Orders          Ordered    cephALEXin  (KEFLEX ) 500 MG capsule  2 times daily        02/27/24 1754    bacitracin  ointment  2 times daily        02/27/24 1754    oxyCODONE  (ROXICODONE ) 5 MG immediate release tablet  Every 4 hours PRN        02/27/24 1754               Shiv Shuey A, PA-C 02/27/24 1802    Ruthe Cornet, DO 02/27/24 2247

## 2024-02-27 NOTE — Discharge Instructions (Signed)
 It is a pleasure taking care of you here today.    We started you on antibiotics given the redness to your hand.  Keep a close eye on this.  We are not able to close the wound on your hand due to timing of when this occurred.  Recommend Tylenol , Motrin  as needed for pain.  I have written you for a short course of oxycodone  for breakthrough pain.  Do not drive or operate heavy machinery while taking this medication.  This medication is an opiate and does have the addictive potential.  Follow-up outpatient, return for any worsening symptoms
